# Patient Record
Sex: Female | Born: 1945 | Race: White | Hispanic: No | Marital: Married | Smoking: Former smoker
Health system: Southern US, Community
[De-identification: ages and names within clinical notes are randomized; demographics above are authoritative.]

## PROBLEM LIST (undated history)

## (undated) DIAGNOSIS — M1990 Unspecified osteoarthritis, unspecified site: Secondary | ICD-10-CM

## (undated) DIAGNOSIS — Z923 Personal history of irradiation: Secondary | ICD-10-CM

## (undated) DIAGNOSIS — K219 Gastro-esophageal reflux disease without esophagitis: Secondary | ICD-10-CM

## (undated) DIAGNOSIS — M7989 Other specified soft tissue disorders: Secondary | ICD-10-CM

## (undated) DIAGNOSIS — T7840XA Allergy, unspecified, initial encounter: Secondary | ICD-10-CM

## (undated) DIAGNOSIS — E119 Type 2 diabetes mellitus without complications: Secondary | ICD-10-CM

## (undated) DIAGNOSIS — C801 Malignant (primary) neoplasm, unspecified: Secondary | ICD-10-CM

## (undated) HISTORY — DX: Allergy, unspecified, initial encounter: T78.40XA

## (undated) HISTORY — DX: Gastro-esophageal reflux disease without esophagitis: K21.9

---

## 1985-05-11 DIAGNOSIS — Z923 Personal history of irradiation: Secondary | ICD-10-CM

## 1985-05-11 DIAGNOSIS — C801 Malignant (primary) neoplasm, unspecified: Secondary | ICD-10-CM

## 1985-05-11 HISTORY — DX: Malignant (primary) neoplasm, unspecified: C80.1

## 1985-05-11 HISTORY — PX: OTHER SURGICAL HISTORY: SHX169

## 1985-05-11 HISTORY — DX: Personal history of irradiation: Z92.3

## 1988-05-11 HISTORY — PX: CHOLECYSTECTOMY: SHX55

## 2004-04-16 ENCOUNTER — Ambulatory Visit: Payer: Self-pay | Admitting: General Surgery

## 2005-07-17 ENCOUNTER — Ambulatory Visit: Payer: Self-pay | Admitting: Family Medicine

## 2005-07-20 LAB — HM COLONOSCOPY

## 2006-09-09 ENCOUNTER — Ambulatory Visit: Payer: Self-pay | Admitting: Family Medicine

## 2006-09-14 ENCOUNTER — Ambulatory Visit: Payer: Self-pay | Admitting: Family Medicine

## 2007-03-21 ENCOUNTER — Ambulatory Visit: Payer: Self-pay | Admitting: Family Medicine

## 2008-12-27 ENCOUNTER — Ambulatory Visit: Payer: Self-pay | Admitting: Family Medicine

## 2009-07-11 LAB — HM PAP SMEAR: HM Pap smear: NORMAL

## 2010-03-10 ENCOUNTER — Ambulatory Visit: Payer: Self-pay | Admitting: Family Medicine

## 2010-09-15 ENCOUNTER — Ambulatory Visit: Payer: Self-pay | Admitting: Family Medicine

## 2011-04-30 ENCOUNTER — Ambulatory Visit: Payer: Self-pay | Admitting: Family Medicine

## 2011-11-17 ENCOUNTER — Encounter: Payer: Self-pay | Admitting: Orthopedic Surgery

## 2011-12-10 ENCOUNTER — Encounter: Payer: Self-pay | Admitting: Orthopedic Surgery

## 2012-05-11 HISTORY — PX: OTHER SURGICAL HISTORY: SHX169

## 2012-11-29 ENCOUNTER — Ambulatory Visit: Payer: Self-pay | Admitting: Gastroenterology

## 2012-11-29 LAB — HM COLONOSCOPY

## 2012-11-30 LAB — PATHOLOGY REPORT

## 2013-05-19 ENCOUNTER — Ambulatory Visit: Payer: Self-pay | Admitting: Family Medicine

## 2013-07-03 ENCOUNTER — Ambulatory Visit: Payer: Self-pay | Admitting: Anesthesiology

## 2013-07-04 ENCOUNTER — Ambulatory Visit: Payer: Self-pay | Admitting: Unknown Physician Specialty

## 2013-07-05 LAB — PATHOLOGY REPORT

## 2014-01-09 LAB — LIPID PANEL
Cholesterol: 182 mg/dL (ref 0–200)
HDL: 57 mg/dL (ref 35–70)
LDL Cholesterol: 108 mg/dL
Triglycerides: 87 mg/dL (ref 40–160)

## 2014-01-09 LAB — BASIC METABOLIC PANEL
BUN: 12 mg/dL (ref 4–21)
CREATININE: 0.8 mg/dL (ref 0.5–1.1)
GLUCOSE: 94 mg/dL
Potassium: 4.6 mmol/L (ref 3.4–5.3)
Sodium: 140 mmol/L (ref 137–147)

## 2014-01-09 LAB — CBC AND DIFFERENTIAL
HEMATOCRIT: 42 % (ref 36–46)
Hemoglobin: 13.6 g/dL (ref 12.0–16.0)
PLATELETS: 298 10*3/uL (ref 150–399)
WBC: 8.5 10^3/mL

## 2014-01-09 LAB — HEMOGLOBIN A1C: HEMOGLOBIN A1C: 5.7 % (ref 4.0–6.0)

## 2014-01-09 LAB — HEPATIC FUNCTION PANEL
ALT: 15 U/L (ref 7–35)
AST: 18 U/L (ref 13–35)
Alkaline Phosphatase: 116 U/L (ref 25–125)

## 2014-01-09 LAB — TSH: TSH: 4.09 u[IU]/mL (ref 0.41–5.90)

## 2014-03-12 ENCOUNTER — Ambulatory Visit: Payer: Self-pay | Admitting: Family Medicine

## 2014-03-12 LAB — HM DEXA SCAN

## 2014-03-21 ENCOUNTER — Ambulatory Visit: Payer: Self-pay | Admitting: Family Medicine

## 2014-03-21 LAB — HM MAMMOGRAPHY

## 2014-04-02 DIAGNOSIS — M1710 Unilateral primary osteoarthritis, unspecified knee: Secondary | ICD-10-CM | POA: Insufficient documentation

## 2014-04-02 DIAGNOSIS — M179 Osteoarthritis of knee, unspecified: Secondary | ICD-10-CM | POA: Insufficient documentation

## 2014-04-10 HISTORY — PX: KNEE ARTHROPLASTY: SHX992

## 2014-05-17 DIAGNOSIS — Z96659 Presence of unspecified artificial knee joint: Secondary | ICD-10-CM | POA: Insufficient documentation

## 2014-09-01 NOTE — Op Note (Signed)
PATIENT NAME:  Amanda Carpenter, WAMBLE MR#:  [id number] DATE OF BIRTH:  February 10, 1946  DATE OF PROCEDURE:  07/04/2013  PREOPERATIVE DIAGNOSIS: Right hemorrhagic vocal cord polyp.  POSTOPERATIVE DIAGNOSIS: Right hemorrhagic vocal cord polyp.   OPERATION PERFORMED: Microlaryngoscopy and excisional biopsy of right vocal cord polyp.   SURGEON: Roena Malady, M.D.   OPERATIVE FINDINGS: A hemorrhagic polyp of the right vocal fold.   DESCRIPTION OF PROCEDURE: Tiki was identified in the holding area, taken to the operating room and placed in supine position. After general endotracheal anesthesia, the table was turned 90 degrees. A Dedo laryngoscope was introduced into the airway and suspended. A tooth guard was placed prior to this to prevent damage to the teeth. With the patient suspended, the table was turned 90 degrees. A 0 degree Hopkins rod was brought into the field. Examination of the larynx showed a hemorrhagic polyp of the right anterior vocal fold with a small area of telangiectasia lateral to this. This was photo documented using the straight rod. With this done, the rod was removed. The operative microscope was brought on the field. Examination of the larynx was the same. A Cottonoid pledget with phenylephrine lidocaine was placed over the polypoid air for approximately 5 minutes and this was removed. The Shapshay microlaryngeal instruments were then brought onto the field. The 45 degree cup forceps were then used to gently grasp this small hemorrhagic polyp and it was excised using the 45 degree scissors. In a similar fashion, the telangiectasia was gently grasped with the straight cups and was also removed. With this removed, The area was then soaked with phenylephrine lidocaine solution. After 5 minutes, the pledget was removed. Examination showed no active bleeding. The photodocumentation was then performed following removal. The patient was then returned to anesthesia where she was awakened in  the operating room and taken to the recovery room in stable condition.   CULTURES: None.   SPECIMENS: Right hemorrhagic polyp.   ESTIMATED BLOOD LOSS: Less than 5 mL.  ____________________________ Roena Malady, MD ctm:aw D: 07/04/2013 10:12:42 ET T: 07/04/2013 10:28:34 ET JOB#: [id number]  cc: Roena Malady, MD, <Dictator> Roena Malady MD ELECTRONICALLY SIGNED 08/08/2013 18:01

## 2014-10-02 DIAGNOSIS — K219 Gastro-esophageal reflux disease without esophagitis: Secondary | ICD-10-CM | POA: Insufficient documentation

## 2014-10-02 DIAGNOSIS — J309 Allergic rhinitis, unspecified: Secondary | ICD-10-CM | POA: Insufficient documentation

## 2014-10-02 DIAGNOSIS — I8390 Asymptomatic varicose veins of unspecified lower extremity: Secondary | ICD-10-CM | POA: Insufficient documentation

## 2014-10-02 DIAGNOSIS — E039 Hypothyroidism, unspecified: Secondary | ICD-10-CM | POA: Insufficient documentation

## 2014-10-02 DIAGNOSIS — N390 Urinary tract infection, site not specified: Secondary | ICD-10-CM | POA: Insufficient documentation

## 2014-10-02 DIAGNOSIS — K52831 Collagenous colitis: Secondary | ICD-10-CM | POA: Insufficient documentation

## 2014-10-02 DIAGNOSIS — Z85819 Personal history of malignant neoplasm of unspecified site of lip, oral cavity, and pharynx: Secondary | ICD-10-CM | POA: Insufficient documentation

## 2014-10-02 DIAGNOSIS — E7800 Pure hypercholesterolemia, unspecified: Secondary | ICD-10-CM | POA: Insufficient documentation

## 2014-10-02 DIAGNOSIS — R0602 Shortness of breath: Secondary | ICD-10-CM | POA: Insufficient documentation

## 2014-10-02 DIAGNOSIS — M8580 Other specified disorders of bone density and structure, unspecified site: Secondary | ICD-10-CM | POA: Insufficient documentation

## 2014-10-02 DIAGNOSIS — R5383 Other fatigue: Secondary | ICD-10-CM | POA: Insufficient documentation

## 2014-10-02 DIAGNOSIS — M1990 Unspecified osteoarthritis, unspecified site: Secondary | ICD-10-CM | POA: Insufficient documentation

## 2014-10-02 DIAGNOSIS — K922 Gastrointestinal hemorrhage, unspecified: Secondary | ICD-10-CM | POA: Insufficient documentation

## 2014-10-02 DIAGNOSIS — R609 Edema, unspecified: Secondary | ICD-10-CM | POA: Insufficient documentation

## 2014-10-02 DIAGNOSIS — E559 Vitamin D deficiency, unspecified: Secondary | ICD-10-CM | POA: Insufficient documentation

## 2014-10-02 DIAGNOSIS — R234 Changes in skin texture: Secondary | ICD-10-CM | POA: Insufficient documentation

## 2014-10-02 DIAGNOSIS — M25569 Pain in unspecified knee: Secondary | ICD-10-CM | POA: Insufficient documentation

## 2015-01-09 ENCOUNTER — Encounter: Payer: Self-pay | Admitting: Family Medicine

## 2015-01-09 ENCOUNTER — Ambulatory Visit (INDEPENDENT_AMBULATORY_CARE_PROVIDER_SITE_OTHER): Payer: Commercial Managed Care - HMO | Admitting: Family Medicine

## 2015-01-09 VITALS — BP 128/72 | HR 66 | Temp 98.3°F | Resp 16 | Ht 63.0 in | Wt 228.0 lb

## 2015-01-09 DIAGNOSIS — Z23 Encounter for immunization: Secondary | ICD-10-CM | POA: Diagnosis not present

## 2015-01-09 DIAGNOSIS — Z0000 Encounter for general adult medical examination without abnormal findings: Secondary | ICD-10-CM | POA: Diagnosis not present

## 2015-01-09 DIAGNOSIS — R7309 Other abnormal glucose: Secondary | ICD-10-CM

## 2015-01-09 DIAGNOSIS — E7800 Pure hypercholesterolemia, unspecified: Secondary | ICD-10-CM

## 2015-01-09 DIAGNOSIS — E039 Hypothyroidism, unspecified: Secondary | ICD-10-CM | POA: Diagnosis not present

## 2015-01-09 DIAGNOSIS — D692 Other nonthrombocytopenic purpura: Secondary | ICD-10-CM

## 2015-01-09 DIAGNOSIS — R7303 Prediabetes: Secondary | ICD-10-CM

## 2015-01-09 NOTE — Progress Notes (Signed)
Patient ID: Amanda Carpenter, female   DOB: 03/14/1946, 69 y.o.   MRN: [id number]        Patient: Amanda Carpenter, Female    DOB: 06/01/1945, 69 y.o.   MRN: [id number] Visit Date: 01/09/2015  Today's Provider: Margarita Rana, MD   Chief Complaint  Patient presents with  . Medicare Wellness   Subjective:    Annual wellness visit Amanda Carpenter is a 69 y.o. female. She feels well. She reports exercising 5 times a week. She reports she is sleeping well.  01/04/14 CPE 08/07/09 Pap-neg 03/21/14 Mammogram-BI-RADS 1 12/01/12 Colonoscopy-recheck in 5 yrs, (less than ideal prep) 03/12/14 BMD-Osteopenia  Lab Results  Component Value Date   WBC 8.5 01/09/2014   HGB 13.6 01/09/2014   HCT 42 01/09/2014   PLT 298 01/09/2014   CHOL 182 01/09/2014   TRIG 87 01/09/2014   HDL 57 01/09/2014   LDLCALC 108 01/09/2014   ALT 15 01/09/2014   AST 18 01/09/2014   NA 140 01/09/2014   K 4.6 01/09/2014   CREATININE 0.8 01/09/2014   BUN 12 01/09/2014   TSH 4.09 01/09/2014   HGBA1C 5.7 01/09/2014     -----------------------------------------------------------   Review of Systems  Constitutional: Negative.   HENT: Negative.   Eyes: Negative.   Respiratory: Negative.   Gastrointestinal: Negative.   Endocrine: Negative.   Genitourinary: Negative.   Musculoskeletal: Positive for joint swelling, arthralgias and gait problem.  Skin: Negative.   Allergic/Immunologic: Negative.   Neurological: Negative.   Hematological: Negative.     Social History   Social History  . Marital Status: Married    Spouse Name: Deidre Ala  . Number of Children: 2  . Years of Education: N/A   Occupational History  . Not on file.   Social History Main Topics  . Smoking status: Former Smoker    Quit date: 05/11/1985  . Smokeless tobacco: Never Used  . Alcohol Use: No  . Drug Use: No  . Sexual Activity: Not on file   Other Topics Concern  . Not on file   Social History Narrative    Patient Active Problem  List   Diagnosis Date Noted  . Allergic rhinitis 10/02/2014  . Arthritis 10/02/2014  . 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 Accumulation of fluid in tissues 10/02/2014  . Fatigue 10/02/2014  . Acid reflux 10/02/2014  . Bleeding gastrointestinal 10/02/2014  . 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 Hypercholesteremia 10/02/2014  . Adult hypothyroidism 10/02/2014  . Gonalgia 10/02/2014  . Adiposity 10/02/2014  . Osteopenia 10/02/2014  . Breath shortness 10/02/2014  . Skin thinning 10/02/2014  . Infection of urinary tract 10/02/2014  . 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 Avitaminosis D 10/02/2014  . 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 Borderline diabetes 08/14/2009    Past Surgical History  Procedure Laterality Date  . Cholecystectomy  1994    Dr. Bary Castilla  . 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 Knee arthroplasty Left 04/2014    Her family history is not on file.    Previous Medications   OMEPRAZOLE (PRILOSEC) 20 MG CAPSULE    Take 20 mg by mouth 2 (two) times daily before a meal.   VITAMIN D, ERGOCALCIFEROL, (DRISDOL) 50000 UNITS CAPS CAPSULE    Take 50,000 Units by mouth every 7 (seven) days.    Patient Care Team: Jerrol Banana., MD as PCP - General (Family Medicine)  Objective:   Vitals: BP 128/72 mmHg  Pulse 66  Temp(Src) 98.3 F (36.8 C) (Oral)  Resp 16  Ht 5\' 3"  (1.6 m)  Wt 228 lb (103.42 kg)  BMI 40.40 kg/m2  SpO2 97%  Physical Exam  Constitutional: She is oriented to person, place, and time. She appears well-developed and well-nourished.  HENT:  Head: Normocephalic and atraumatic.  Right Ear: Tympanic membrane, external ear and ear canal normal.  Left Ear: Tympanic membrane, external ear and ear canal normal.  Nose: Nose normal.  Mouth/Throat: Uvula is midline, oropharynx is clear and moist and mucous membranes are normal.  Eyes: Conjunctivae, EOM and lids are normal.  Pupils are equal, round, and reactive to light.  Neck: Trachea normal and normal range of motion. Neck supple. Carotid bruit is not present. No thyroid mass and no thyromegaly present.  Cardiovascular: Normal rate, regular rhythm and normal heart sounds.   Pulmonary/Chest: Effort normal and breath sounds normal.  Abdominal: Soft. Normal appearance and bowel sounds are normal. There is no hepatosplenomegaly. There is no tenderness.  Genitourinary: No breast swelling, tenderness or discharge.  Musculoskeletal: Normal range of motion.  Lymphadenopathy:    She has no cervical adenopathy.    She has no axillary adenopathy.  Neurological: She is alert and oriented to person, place, and time. She has normal strength. No cranial nerve deficit.  Skin: Skin is warm, dry and intact.  Heme staining, thick toe nails.  Psychiatric: She has a normal mood and affect. Her speech is normal and behavior is normal. Judgment and thought content normal. Cognition and memory are normal.    Activities of Daily Living In your present state of health, do you have any difficulty performing the following activities: 01/09/2015  Hearing? N  Vision? N  Difficulty concentrating or making decisions? Y  Walking or climbing stairs? N  Dressing or bathing? N  Doing errands, shopping? N    Fall Risk Assessment Fall Risk  01/09/2015  Falls in the past year? No     Depression Screen PHQ 2/9 Scores 01/09/2015  PHQ - 2 Score 0    Cognitive Testing - 6-CIT  Correct? Score   What year is it? yes 0 0 or 4  What month is it? yes 0 0 or 3  Memorize:    Pia Mau,  41,  [address],  [place],      What time is it? (within 1 hour) yes 0 0 or 3  Count backwards from 20 yes 0 0, 2, or 4  Name the months of the year yes 0 0, 2, or 4  Repeat name & address above yes 0 0, 2, 4, 6, 8, or 10       TOTAL SCORE  0/28   Interpretation:  Normal  Normal (0-7) Abnormal (8-28)       Assessment & Plan:     Annual Wellness  Visit  Reviewed patient's Family Medical History Reviewed and updated list of patient's medical providers Assessment of cognitive impairment was done Assessed patient's functional ability Established a written schedule for health screening [place] Completed and Reviewed  Exercise Activities and Dietary recommendations Goals    . Exercise 150 minutes per week (moderate activity)       Immunization History  Administered Date(s) Administered  . Pneumococcal Conjugate-13 01/09/2015  . Pneumococcal Polysaccharide-23 11/02/2012  . Td 05/20/2005  . Tdap 02/16/2011    Health Maintenance  Topic Date Due  . Hepatitis C Screening  May 05, 1946  . ZOSTAVAX  01/28/2005  . DEXA SCAN  01/28/2010  . INFLUENZA VACCINE  12/10/2014  . COLONOSCOPY  07/21/2015  . MAMMOGRAM  03/21/2016  . TETANUS/TDAP  02/15/2021  . PNA vac Low Risk Adult  Completed       1. Medicare annual wellness visit, subsequent Stable. Patient advised to continue eating healthy and exercise daily.  2. Borderline diabetes - Hemoglobin A1c  3. Hypothyroidism, unspecified hypothyroidism type - CBC with Differential/Platelet - Comprehensive metabolic panel - T4 AND TSH  4. Hypercholesteremia - CBC with Differential/Platelet - Comprehensive metabolic panel - Lipid Panel With LDL/HDL Ratio  5. Need for pneumococcal vaccination - Pneumococcal conjugate vaccine 13-valent IM  6. Senile purpura- New diagnosis.      Patient seen and examined by Dr. Jerrell Belfast, and note scribed by Philbert Riser. Dimas, CMA.  I have reviewed the document for accuracy and completeness and I agree with above. Jerrell Belfast, MD     ------------------------------------------------------------------------------------------------------------

## 2015-01-09 NOTE — Progress Notes (Deleted)
   Subjective:    Patient ID: Amanda Carpenter, female    DOB: 04-10-1946, 69 y.o.   MRN: [id number]  HPI    Review of Systems     Objective:   Physical Exam        Assessment & Plan:

## 2015-01-10 LAB — LIPID PANEL WITH LDL/HDL RATIO
CHOLESTEROL TOTAL: 223 mg/dL — AB (ref 100–199)
HDL: 58 mg/dL (ref >39)
LDL CALC: 142 mg/dL — AB (ref 0–99)
LDl/HDL Ratio: 2.4 ratio units (ref 0.0–3.2)
TRIGLYCERIDES: 113 mg/dL (ref 0–149)
VLDL CHOLESTEROL CAL: 23 mg/dL (ref 5–40)

## 2015-01-10 LAB — COMPREHENSIVE METABOLIC PANEL
ALK PHOS: 128 IU/L — AB (ref 39–117)
ALT: 11 IU/L (ref 0–32)
AST: 16 IU/L (ref 0–40)
Albumin/Globulin Ratio: 2 (ref 1.1–2.5)
Albumin: 4.4 g/dL (ref 3.6–4.8)
BUN/Creatinine Ratio: 17 (ref 11–26)
BUN: 13 mg/dL (ref 8–27)
Bilirubin Total: 0.3 mg/dL (ref 0.0–1.2)
CALCIUM: 9.7 mg/dL (ref 8.7–10.3)
CO2: 25 mmol/L (ref 18–29)
CREATININE: 0.77 mg/dL (ref 0.57–1.00)
Chloride: 100 mmol/L (ref 97–108)
GFR calc Af Amer: 91 mL/min/{1.73_m2} (ref >59)
GFR, EST NON AFRICAN AMERICAN: 79 mL/min/{1.73_m2} (ref >59)
GLUCOSE: 103 mg/dL — AB (ref 65–99)
Globulin, Total: 2.2 g/dL (ref 1.5–4.5)
Potassium: 4.5 mmol/L (ref 3.5–5.2)
Sodium: 139 mmol/L (ref 134–144)
Total Protein: 6.6 g/dL (ref 6.0–8.5)

## 2015-01-10 LAB — CBC WITH DIFFERENTIAL/PLATELET
BASOS ABS: 0.1 10*3/uL (ref 0.0–0.2)
Basos: 1 %
EOS (ABSOLUTE): 0.1 10*3/uL (ref 0.0–0.4)
EOS: 2 %
HEMOGLOBIN: 13.7 g/dL (ref 11.1–15.9)
Hematocrit: 40.2 % (ref 34.0–46.6)
IMMATURE GRANULOCYTES: 0 %
Immature Grans (Abs): 0 10*3/uL (ref 0.0–0.1)
LYMPHS ABS: 2.3 10*3/uL (ref 0.7–3.1)
Lymphs: 27 %
MCH: 28.9 pg (ref 26.6–33.0)
MCHC: 34.1 g/dL (ref 31.5–35.7)
MCV: 85 fL (ref 79–97)
MONOCYTES: 7 %
MONOS ABS: 0.6 10*3/uL (ref 0.1–0.9)
NEUTROS PCT: 63 %
Neutrophils Absolute: 5.3 10*3/uL (ref 1.4–7.0)
Platelets: 308 10*3/uL (ref 150–379)
RBC: 4.74 x10E6/uL (ref 3.77–5.28)
RDW: 14.2 % (ref 12.3–15.4)
WBC: 8.3 10*3/uL (ref 3.4–10.8)

## 2015-01-10 LAB — HEMOGLOBIN A1C
Est. average glucose Bld gHb Est-mCnc: 117 mg/dL
HEMOGLOBIN A1C: 5.7 % — AB (ref 4.8–5.6)

## 2015-01-10 LAB — T4 AND TSH
T4, Total: 9.1 ug/dL (ref 4.5–12.0)
TSH: 3.08 u[IU]/mL (ref 0.450–4.500)

## 2015-01-11 ENCOUNTER — Telehealth: Payer: Self-pay

## 2015-01-11 NOTE — Telephone Encounter (Signed)
-----   Message from Margarita Rana, MD sent at 01/10/2015  4:11 PM EDT ----- Labs stable.  Cholesterol slightly elevated from previously. Recommend eat healthy and exercise and recheck annually. Thanks.

## 2015-01-11 NOTE — Telephone Encounter (Signed)
LMTCB 01/11/2015  Thanks,   -Islam Eichinger  

## 2015-01-11 NOTE — Telephone Encounter (Signed)
Pt advised as directed below.   Thanks,   -Dewell Monnier  

## 2015-02-19 ENCOUNTER — Telehealth: Payer: Self-pay

## 2015-02-19 NOTE — Telephone Encounter (Signed)
Pt called because [employer] will not cover recent labs. Claims reports that the labs were billed under a general health panel code. Sent a corrective claim to fax # [phone]. Pt informed that corrective claim was sent in. Renaldo Fiddler, CMA

## 2015-03-19 ENCOUNTER — Other Ambulatory Visit: Payer: Self-pay | Admitting: Family Medicine

## 2015-03-19 MED ORDER — TRAMADOL HCL 50 MG PO TABS: 50.0000 mg | ORAL_TABLET | ORAL | Status: DC | PRN

## 2015-03-19 NOTE — Telephone Encounter (Signed)
Looks like patient is seen Dr. Venia Minks for CPEs but otherwise sees Dr. Rosanna Randy. Last time we wrote Tramadol was on 03/29/13 for knee pain and no more after that. Please review. Needs appt?-aa

## 2015-03-19 NOTE — Telephone Encounter (Signed)
Done-aa 

## 2015-03-19 NOTE — Telephone Encounter (Signed)
Tramadol 50mg  q 4 hrs prn,#100,5rf.

## 2015-03-19 NOTE — Telephone Encounter (Signed)
Pt stated that she had her CPE with Dr. Venia Minks in August and forgot to request a refill on Tramadol. Pt stated that her ortho had been prescribing the medication. Dr. Rosanna Randy wrote it for pt on 03/29/13. I advised pt she might need an OV. Please advise. Thanks TNP

## 2015-04-08 ENCOUNTER — Ambulatory Visit (INDEPENDENT_AMBULATORY_CARE_PROVIDER_SITE_OTHER): Payer: Commercial Managed Care - HMO | Admitting: Family Medicine

## 2015-04-08 VITALS — BP 118/74 | HR 80 | Temp 98.4°F | Resp 18 | Ht 62.0 in | Wt 232.0 lb

## 2015-04-08 DIAGNOSIS — R059 Cough, unspecified: Secondary | ICD-10-CM

## 2015-04-08 DIAGNOSIS — R05 Cough: Secondary | ICD-10-CM | POA: Diagnosis not present

## 2015-04-08 DIAGNOSIS — J40 Bronchitis, not specified as acute or chronic: Secondary | ICD-10-CM

## 2015-04-08 MED ORDER — AMOXICILLIN-POT CLAVULANATE 875-125 MG PO TABS: 1.0000 | ORAL_TABLET | Freq: Two times a day (BID) | ORAL | Status: DC

## 2015-04-08 NOTE — Progress Notes (Signed)
Patient ID: Amanda Carpenter, female   DOB: [birth year], 69 y.o.   MRN: [id number]   Amanda Carpenter  MRN: [id number] DOB: Sep 07, 1945  Subjective:  HPI   1. Cough Patient is a 69 year old female who presents for evaluation of cough.  She states she began coughing 4 days ago.  She is able to cough some sputum up and notes it has been greenish in color.  She has not used any expectorant but states she had some old Tussinex at home and has been using that at night and it does help her to sleep.  No other family members have had any of the same symptoms.  Patient has not had a flu vaccine this year and states that she does not usually take one.  Patient Active Problem List   Diagnosis Date Noted  . Allergic rhinitis 10/02/2014  . Arthritis 10/02/2014  . 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 Accumulation of fluid in tissues 10/02/2014  . Fatigue 10/02/2014  . Acid reflux 10/02/2014  . Bleeding gastrointestinal 10/02/2014  . 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 Hypercholesteremia 10/02/2014  . Adult hypothyroidism 10/02/2014  . Gonalgia 10/02/2014  . Adiposity 10/02/2014  . Osteopenia 10/02/2014  . Breath shortness 10/02/2014  . Skin thinning 10/02/2014  . Infection of urinary tract 10/02/2014  . 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 Avitaminosis D 10/02/2014  . 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 Borderline diabetes 08/14/2009    Past Medical History  Diagnosis Date  . GERD (gastroesophageal reflux disease)   . Allergy     Social History   Social History  . Marital Status: Married    Spouse Name: Deidre Ala  . Number of Children: 2  . Years of Education: N/A   Occupational History  . Not on file.   Social History Main Topics  . Smoking status: Former Smoker    Quit date: 05/11/1985  . Smokeless tobacco: Never Used  . Alcohol Use: No  . Drug Use: No  . Sexual Activity: Not on file   Other Topics Concern  . Not on  file   Social History Narrative    Outpatient Prescriptions Prior to Visit  Medication Sig Dispense Refill  . omeprazole (PRILOSEC) 20 MG capsule Take 20 mg by mouth 2 (two) times daily before a meal.    . traMADol (ULTRAM) 50 MG tablet Take 1 tablet (50 mg total) by mouth every 4 (four) hours as needed. 100 tablet 5  . Vitamin D, Ergocalciferol, (DRISDOL) 50000 UNITS CAPS capsule Take 50,000 Units by mouth every 7 (seven) days.     No facility-administered medications prior to visit.    No Known Allergies  Review of Systems  Constitutional: Positive for chills and malaise/fatigue. Negative for fever and diaphoresis.  HENT: Positive for congestion, sore throat and tinnitus (Chronic and unchanged.). Negative for ear discharge, ear pain, hearing loss and nosebleeds.   Eyes: Negative for blurred vision, double vision, photophobia, pain, discharge and redness.  Respiratory: Positive for cough, sputum production and wheezing. Negative for hemoptysis, shortness of breath and stridor.   Cardiovascular: Negative for chest pain, palpitations, orthopnea and leg swelling.  Neurological: Negative for weakness and headaches.   Objective:  BP 118/74 mmHg  Pulse 80  Temp(Src) 98.4 F (36.9 C) (Oral)  Resp 18  Ht 5\' 2"  (1.575 m)  Wt 232 lb (105.235 kg)  BMI 42.42 kg/m2  Physical Exam  Constitutional: She is well-developed, well-nourished, and  in no distress.  HENT:  Head: Normocephalic and atraumatic.  Right Ear: External ear normal.  Left Ear: External ear normal.  Nose: Nose normal.  Mouth/Throat: Oropharynx is clear and moist.  Eyes: Conjunctivae and EOM are normal. Pupils are equal, round, and reactive to light.  Neck: Normal range of motion. Neck supple.  Chronic right side goiter  Cardiovascular: Normal rate, regular rhythm and normal heart sounds.   Pulmonary/Chest: Effort normal and breath sounds normal.    Assessment and Plan :  Cough  URI Bronchits Rx if she  worsens. Miguel Aschoff MD [facility] 04/08/2015 9:37 AM

## 2015-05-27 DIAGNOSIS — M1711 Unilateral primary osteoarthritis, right knee: Secondary | ICD-10-CM | POA: Diagnosis not present

## 2015-05-27 DIAGNOSIS — M25561 Pain in right knee: Secondary | ICD-10-CM | POA: Diagnosis not present

## 2015-05-31 ENCOUNTER — Other Ambulatory Visit: Payer: Self-pay | Admitting: Family Medicine

## 2015-05-31 MED ORDER — OMEPRAZOLE 20 MG PO CPDR: 20.0000 mg | DELAYED_RELEASE_CAPSULE | Freq: Two times a day (BID) | ORAL | Status: DC

## 2015-05-31 MED ORDER — VITAMIN D (ERGOCALCIFEROL) 1.25 MG (50000 UNIT) PO CAPS: 50000.0000 [IU] | ORAL_CAPSULE | ORAL | Status: DC

## 2015-05-31 NOTE — Telephone Encounter (Signed)
This is not Dr. Alben Spittle patient. Thank you-aa

## 2015-05-31 NOTE — Telephone Encounter (Signed)
Yes--1 year refills. 

## 2015-05-31 NOTE — Telephone Encounter (Signed)
Pt contacted office for refill request on the following medications: omeprazole (PRILOSEC) 20 MG capsule & Vitamin D, Ergocalciferol, (DRISDOL) 50000 UNITS CAPS capsule to [facility] for 90 day supply. Thanks TNP

## 2015-05-31 NOTE — Telephone Encounter (Signed)
Is this ok?-aa 

## 2015-06-03 ENCOUNTER — Ambulatory Visit (INDEPENDENT_AMBULATORY_CARE_PROVIDER_SITE_OTHER): Payer: PPO | Admitting: Family Medicine

## 2015-06-03 VITALS — BP 120/80 | HR 78 | Temp 98.0°F | Resp 16 | Wt 228.0 lb

## 2015-06-03 DIAGNOSIS — R600 Localized edema: Secondary | ICD-10-CM

## 2015-06-03 DIAGNOSIS — K219 Gastro-esophageal reflux disease without esophagitis: Secondary | ICD-10-CM

## 2015-06-03 DIAGNOSIS — E559 Vitamin D deficiency, unspecified: Secondary | ICD-10-CM | POA: Diagnosis not present

## 2015-06-03 DIAGNOSIS — R609 Edema, unspecified: Secondary | ICD-10-CM | POA: Diagnosis not present

## 2015-06-03 MED ORDER — FUROSEMIDE 20 MG PO TABS: 20.0000 mg | ORAL_TABLET | Freq: Every day | ORAL | Status: DC

## 2015-06-03 MED ORDER — VITAMIN D 1000 UNITS PO TABS: 1000.0000 [IU] | ORAL_TABLET | Freq: Every day | ORAL | Status: DC

## 2015-06-03 MED ORDER — OMEPRAZOLE 20 MG PO CPDR: 20.0000 mg | DELAYED_RELEASE_CAPSULE | Freq: Two times a day (BID) | ORAL | Status: DC

## 2015-06-03 NOTE — Progress Notes (Signed)
Patient ID: Amanda Carpenter, female   DOB: 1945-07-11, 70 y.o.   MRN: [id number]   Lema Stidham  MRN: [id number] DOB: [birth year]  Subjective:  HPI  1. Swelling The patient is a 70 year old female who presents for evaluation of swelling.  She has noticed swelling of her lower extremity, right lower leg.  She states that she has a bad knee on that side.  She recently had an ultrasound done at [facility] and it did reveal fluid on her knee.  She decided not to have the fluid aspirated.  The patient found some Lasix that she had been given in the past and has used that for a few days with some relief.  Patient Active Problem List   Diagnosis Date Noted  . Allergic rhinitis 10/02/2014  . Arthritis 10/02/2014  . 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 Accumulation of fluid in tissues 10/02/2014  . Fatigue 10/02/2014  . Acid reflux 10/02/2014  . Bleeding gastrointestinal 10/02/2014  . 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 Hypercholesteremia 10/02/2014  . Adult hypothyroidism 10/02/2014  . Gonalgia 10/02/2014  . Adiposity 10/02/2014  . Osteopenia 10/02/2014  . Breath shortness 10/02/2014  . Skin thinning 10/02/2014  . Infection of urinary tract 10/02/2014  . 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 Avitaminosis D 10/02/2014  . 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 Borderline diabetes 08/14/2009    Past Medical History  Diagnosis Date  . GERD (gastroesophageal reflux disease)   . Allergy     Social History   Social History  . Marital Status: Married    Spouse Name: Deidre Ala  . Number of Children: 2  . Years of Education: N/A   Occupational History  . Not on file.   Social History Main Topics  . Smoking status: Former Smoker    Quit date: 05/11/1985  . Smokeless tobacco: Never Used  . Alcohol Use: No  . Drug Use: No  . Sexual Activity: Not on file   Other Topics Concern  . Not on file   Social History Narrative     Outpatient Prescriptions Prior to Visit  Medication Sig Dispense Refill  . omeprazole (PRILOSEC) 20 MG capsule Take 1 capsule (20 mg total) by mouth 2 (two) times daily before a meal. 60 capsule 5  . traMADol (ULTRAM) 50 MG tablet Take 1 tablet (50 mg total) by mouth every 4 (four) hours as needed. 100 tablet 5  . Vitamin D, Ergocalciferol, (DRISDOL) 50000 units CAPS capsule Take 1 capsule (50,000 Units total) by mouth every 7 (seven) days. 12 capsule 3  . amoxicillin-clavulanate (AUGMENTIN) 875-125 MG tablet Take 1 tablet by mouth 2 (two) times daily. 14 tablet 0   No facility-administered medications prior to visit.    No Known Allergies  Review of Systems  Constitutional: Negative for fever and chills.  Respiratory: Negative for cough.   Cardiovascular: Positive for leg swelling. Negative for chest pain, palpitations and orthopnea.  Musculoskeletal: Positive for joint pain (Knees). Negative for myalgias, back pain and neck pain.  Neurological: Negative for weakness.   Objective:  BP 120/80 mmHg  Pulse 78  Temp(Src) 98 F (36.7 C) (Oral)  Resp 16  Wt 228 lb (103.42 kg)  Physical Exam  Assessment and Plan :    1. Swelling  - furosemide (LASIX) 20 MG tablet; Take 1 tablet (20 mg total) by mouth daily.  Dispense: 90 tablet; Refill: 3  2. Lower leg edema  - furosemide (  LASIX) 20 MG tablet; Take 1 tablet (20 mg total) by mouth daily.  Dispense: 90 tablet; Refill: 3  3. Vitamin D deficiency  - cholecalciferol (VITAMIN D) 1000 units tablet; Take 1 tablet (1,000 Units total) by mouth daily.  Dispense: 90 tablet; Refill: 3  4. Gastroesophageal reflux disease, esophagitis presence not specified  - omeprazole (PRILOSEC) 20 MG capsule; Take 1 capsule (20 mg total) by mouth 2 (two) times daily before a meal.  Dispense: 180 capsule; Refill: [facility] 06/03/2015 3:00 PM

## 2015-06-04 ENCOUNTER — Other Ambulatory Visit: Payer: Self-pay

## 2015-07-18 DIAGNOSIS — H903 Sensorineural hearing loss, bilateral: Secondary | ICD-10-CM | POA: Diagnosis not present

## 2015-07-18 DIAGNOSIS — H9313 Tinnitus, bilateral: Secondary | ICD-10-CM | POA: Diagnosis not present

## 2015-08-09 DIAGNOSIS — M1711 Unilateral primary osteoarthritis, right knee: Secondary | ICD-10-CM | POA: Diagnosis not present

## 2015-09-24 DIAGNOSIS — M1711 Unilateral primary osteoarthritis, right knee: Secondary | ICD-10-CM | POA: Diagnosis not present

## 2015-11-05 ENCOUNTER — Emergency Department
Admission: EM | Admit: 2015-11-05 | Discharge: 2015-11-05 | Disposition: A | Discharge status: Home / Self Care | Payer: PPO | Attending: Emergency Medicine | Admitting: Emergency Medicine

## 2015-11-05 DIAGNOSIS — W1839XA Other fall on same level, initial encounter: Secondary | ICD-10-CM | POA: Insufficient documentation

## 2015-11-05 DIAGNOSIS — M1990 Unspecified osteoarthritis, unspecified site: Secondary | ICD-10-CM | POA: Diagnosis not present

## 2015-11-05 DIAGNOSIS — Y939 Activity, unspecified: Secondary | ICD-10-CM | POA: Insufficient documentation

## 2015-11-05 DIAGNOSIS — S81812A Laceration without foreign body, left lower leg, initial encounter: Secondary | ICD-10-CM | POA: Diagnosis not present

## 2015-11-05 DIAGNOSIS — Z87891 Personal history of nicotine dependence: Secondary | ICD-10-CM | POA: Insufficient documentation

## 2015-11-05 DIAGNOSIS — M179 Osteoarthritis of knee, unspecified: Secondary | ICD-10-CM | POA: Diagnosis not present

## 2015-11-05 DIAGNOSIS — Y9289 Other specified places as the place of occurrence of the external cause: Secondary | ICD-10-CM | POA: Diagnosis not present

## 2015-11-05 DIAGNOSIS — Z79899 Other long term (current) drug therapy: Secondary | ICD-10-CM | POA: Insufficient documentation

## 2015-11-05 DIAGNOSIS — Z96659 Presence of unspecified artificial knee joint: Secondary | ICD-10-CM | POA: Diagnosis not present

## 2015-11-05 DIAGNOSIS — E039 Hypothyroidism, unspecified: Secondary | ICD-10-CM | POA: Insufficient documentation

## 2015-11-05 DIAGNOSIS — Y999 Unspecified external cause status: Secondary | ICD-10-CM | POA: Diagnosis not present

## 2015-11-05 MED ORDER — LIDOCAINE HCL (PF) 1 % IJ SOLN
INTRAMUSCULAR | Status: AC
  Administered 2015-11-05: 18:00:00
  Filled 2015-11-05: qty 5

## 2015-11-05 MED ORDER — TRAMADOL HCL 50 MG PO TABS: 50.0000 mg | ORAL_TABLET | Freq: Four times a day (QID) | ORAL | Status: DC | PRN

## 2015-11-05 NOTE — Discharge Instructions (Signed)
Laceration Care, Adult  A laceration is a cut that goes through all layers of the skin. The cut also goes into the tissue that is right under the skin. Some cuts heal on their own. Others need to be closed with stitches (sutures), staples, skin adhesive strips, or wound glue. Taking care of your cut lowers your risk of infection and helps your cut to heal better.  HOW TO TAKE CARE OF YOUR CUT  For stitches or staples:  · Keep the wound clean and dry.  · If you were given a bandage (dressing), you should change it at least one time per day or as told by your doctor. You should also change it if it gets wet or dirty.  · Keep the wound completely dry for the first 24 hours or as told by your doctor. After that time, you may take a shower or a bath. However, make sure that the wound is not soaked in water until after the stitches or staples have been removed.  · Clean the wound one time each day or as told by your doctor:    Wash the wound with soap and water.    Rinse the wound with water until all of the soap comes off.    Pat the wound dry with a clean towel. Do not rub the wound.  · After you clean the wound, put a thin layer of antibiotic ointment on it as told by your doctor. This ointment:    Helps to prevent infection.    Keeps the bandage from sticking to the wound.  · Have your stitches or staples removed as told by your doctor.  If your doctor used skin adhesive strips:   · Keep the wound clean and dry.  · If you were given a bandage, you should change it at least one time per day or as told by your doctor. You should also change it if it gets dirty or wet.  · Do not get the skin adhesive strips wet. You can take a shower or a bath, but be careful to keep the wound dry.  · If the wound gets wet, pat it dry with a clean towel. Do not rub the wound.  · Skin adhesive strips fall off on their own. You can trim the strips as the wound heals. Do not remove any strips that are still stuck to the wound. They will  fall off after a while.  If your doctor used wound glue:  · Try to keep your wound dry, but you may briefly wet it in the shower or bath. Do not soak the wound in water, such as by swimming.  · After you take a shower or a bath, gently pat the wound dry with a clean towel. Do not rub the wound.  · Do not do any activities that will make you really sweaty until the skin glue has fallen off on its own.  · Do not apply liquid, cream, or ointment medicine to your wound while the skin glue is still on.  · If you were given a bandage, you should change it at least one time per day or as told by your doctor. You should also change it if it gets dirty or wet.  · If a bandage is placed over the wound, do not let the tape for the bandage touch the skin glue.  · Do not pick at the glue. The skin glue usually stays on for 5-10 days. Then, it   falls off of the skin.  General Instructions   · To help prevent scarring, make sure to cover your wound with sunscreen whenever you are outside after stitches are removed, after adhesive strips are removed, or when wound glue stays in place and the wound is healed. Make sure to wear a sunscreen of at least 30 SPF.  · Take over-the-counter and prescription medicines only as told by your doctor.  · If you were given antibiotic medicine or ointment, take or apply it as told by your doctor. Do not stop using the antibiotic even if your wound is getting better.  · Do not scratch or pick at the wound.  · Keep all follow-up visits as told by your doctor. This is important.  · Check your wound every day for signs of infection. Watch for:    Redness, swelling, or pain.    Fluid, blood, or pus.  · Raise (elevate) the injured area above the level of your heart while you are sitting or lying down, if possible.  GET HELP IF:  · You got a tetanus shot and you have any of these problems at the injection site:    Swelling.    Very bad pain.    Redness.    Bleeding.  · You have a fever.  · A wound that was  closed breaks open.  · You notice a bad smell coming from your wound or your bandage.  · You notice something coming out of the wound, such as wood or glass.  · Medicine does not help your pain.  · You have more redness, swelling, or pain at the site of your wound.  · You have fluid, blood, or pus coming from your wound.  · You notice a change in the color of your skin near your wound.  · You need to change the bandage often because fluid, blood, or pus is coming from the wound.  · You start to have a new rash.  · You start to have numbness around the wound.  GET HELP RIGHT AWAY IF:  · You have very bad swelling around the wound.  · Your pain suddenly gets worse and is very bad.  · You notice painful lumps near the wound or on skin that is anywhere on your body.  · You have a red streak going away from your wound.  · The wound is on your hand or foot and you cannot move a finger or toe like you usually can.  · The wound is on your hand or foot and you notice that your fingers or toes look pale or bluish.     This information is not intended to replace advice given to you by your health care provider. Make sure you discuss any questions you have with your health care provider.     Document Released: 10/14/2007 Document Revised: 09/11/2014 Document Reviewed: 04/23/2014  Elsevier Interactive Patient Education ©2016 [employer]

## 2015-11-05 NOTE — ED Notes (Signed)
Pt states she a container of ice cream fell at [employer] and tore the skin on her LLE , pt has varicose veins noted.. Pt has bandage in place on arrival with controlled bleeding.

## 2015-11-05 NOTE — ED Provider Notes (Signed)
[facility] Emergency Department Provider Note   ____________________________________________  Time seen: Approximately 4:52 PM  I have reviewed the triage vital signs and the nursing notes.   HISTORY  Chief Complaint Extremity Laceration    HPI Amanda Carpenter is a 70 y.o. female patient with a laceration to the left lower leg. Patient stated containing ice cream fell on her leg at the store. Initial treatment done by the pharmacist insisted level pressure dressing to control bleeding. Patient denies loss of sensation or loss of function of the left lower extremity. Patient has a history of varicose veins. No veins involving this incident. She rates her pain discomfort as a 4/10. No other palliative measures for this complaint.Patient has fairly thin skin.   Past Medical History  Diagnosis Date  . GERD (gastroesophageal reflux disease)   . Allergy     Patient Active Problem List   Diagnosis Date Noted  . Allergic rhinitis 10/02/2014  . Arthritis 10/02/2014  . 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 Accumulation of fluid in tissues 10/02/2014  . Fatigue 10/02/2014  . Acid reflux 10/02/2014  . Bleeding gastrointestinal 10/02/2014  . 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 Hypercholesteremia 10/02/2014  . Adult hypothyroidism 10/02/2014  . Gonalgia 10/02/2014  . Adiposity 10/02/2014  . Osteopenia 10/02/2014  . Breath shortness 10/02/2014  . Skin thinning 10/02/2014  . Infection of urinary tract 10/02/2014  . 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 Avitaminosis D 10/02/2014  . 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 Borderline diabetes 08/14/2009    Past Surgical History  Procedure Laterality Date  . Cholecystectomy  1994    Dr. Bary Castilla  . 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 Knee arthroplasty Left 04/2014    Current Outpatient Rx  Name  Route  Sig  Dispense  Refill  .  cholecalciferol (VITAMIN D) 1000 units tablet   Oral   Take 1 tablet (1,000 Units total) by mouth daily.   90 tablet   3   . furosemide (LASIX) 20 MG tablet   Oral   Take 1 tablet (20 mg total) by mouth daily.   90 tablet   3   . omeprazole (PRILOSEC) 20 MG capsule   Oral   Take 1 capsule (20 mg total) by mouth 2 (two) times daily before a meal.   180 capsule   3   . traMADol (ULTRAM) 50 MG tablet   Oral   Take 1 tablet (50 mg total) by mouth every 4 (four) hours as needed.   100 tablet   5   . traMADol (ULTRAM) 50 MG tablet   Oral   Take 1 tablet (50 mg total) by mouth every 6 (six) hours as needed.   20 tablet   0     Allergies Review of patient's allergies indicates no known allergies.  No family history on file.  Social History Social History  Substance Use Topics  . Smoking status: Former Smoker    Quit date: 05/11/1985  . Smokeless tobacco: Never Used  . Alcohol Use: No    Review of Systems Constitutional: No fever/chills Eyes: No visual changes. ENT: No sore throat. Cardiovascular: Denies chest pain. Respiratory: Denies shortness of breath. Gastrointestinal: No abdominal pain.  No nausea, no vomiting.  No diarrhea.  No constipation. Genitourinary: Negative for dysuria. Musculoskeletal: Negative for back pain. Skin: Negative for rash.Laceration left lower leg Neurological: Negative for  headaches, focal weakness or numbness.  .  ____________________________________________   PHYSICAL EXAM:  VITAL SIGNS: ED Triage Vitals  Enc Vitals Group     BP 11/05/15 1628 143/65 mmHg     Pulse Rate 11/05/15 1628 73     Resp 11/05/15 1628 18     Temp 11/05/15 1628 98.9 F (37.2 C)     Temp Source 11/05/15 1628 Oral     SpO2 11/05/15 1628 96 %     Weight 11/05/15 1628 210 lb (95.255 kg)     Height 11/05/15 1628 5\' 3"  (1.6 m)     Head Cir --      Peak Flow --      Pain Score --      Pain Loc --      Pain Edu? --      Excl. in [place]? --      Constitutional: Alert and oriented. Well appearing and in no acute distress. Eyes: Conjunctivae are normal. PERRL. EOMI. Head: Atraumatic. Nose: No congestion/rhinnorhea. Mouth/Throat: Mucous membranes are moist.  Oropharynx non-erythematous. Neck: No stridor. No cervical spine tenderness to palpation. Hematological/Lymphatic/Immunilogical: No cervical lymphadenopathy. Cardiovascular: Normal rate, regular rhythm. Grossly normal heart sounds.  Good peripheral circulation. Respiratory: Normal respiratory effort.  No retractions. Lungs CTAB. Gastrointestinal: Soft and nontender. No distention. No abdominal bruits. No CVA tenderness. Musculoskeletal: No lower extremity tenderness nor edema.  No joint effusions. Neurologic:  Normal speech and language. No gross focal neurologic deficits are appreciated. No gait instability. Skin:  Skin is warm, dry and intact. No rash noted. Psychiatric: Mood and affect are normal. Speech and behavior are normal.  ____________________________________________   LABS (all labs ordered are listed, but only abnormal results are displayed)  Labs Reviewed - No data to display ____________________________________________  EKG   ____________________________________________  RADIOLOGY   ____________________________________________   PROCEDURES  Procedure(s) performed: LACERATION REPAIR Performed by: Sable Feil Authorized by: Sable Feil Consent: Verbal consent obtained. Risks and benefits: risks, benefits and alternatives were discussed Consent given by: patient Patient identity confirmed: provided demographic data Prepped and Draped in normal sterile fashion Wound explored  Laceration Location: Left lower leg  Laceration Length: 4 cm  No Foreign Bodies seen or palpated  Anesthesia: local infiltration  Local anesthetic: lidocaine 1% with epinephrine  Anesthetic total: 8 ml  Irrigation method: syringe Amount of cleaning:  standard  Skin closure: 4 nylon Number of sutures: Chest pain  Technique: Interrupted   Patient tolerance: Patient tolerated the procedure well with no immediate complications.   Critical Care performed: No  ____________________________________________   INITIAL IMPRESSION / ASSESSMENT AND PLAN / ED COURSE  Pertinent labs & imaging results that were available during my care of the patient were reviewed by me and considered in my medical decision making (see chart for details).  Laceration left lower leg. Patient given discharge care instructions. Patient given a prescription for tramadol. Patient advised to have sutures removed in 10 days by [facility] doctor. Patient advised return by ER if he opens before complete healing process. ____________________________________________   FINAL CLINICAL IMPRESSION(S) / ED DIAGNOSES  Final diagnoses:  Leg laceration, left, initial encounter      NEW MEDICATIONS STARTED DURING THIS VISIT:  New Prescriptions   TRAMADOL (ULTRAM) 50 MG TABLET    Take 1 tablet (50 mg total) by mouth every 6 (six) hours as needed.     Note:  This document was prepared using Dragon voice recognition software and may include unintentional dictation errors.  Sable Feil, PA-C 11/05/15 1756  Schuyler Amor, MD 11/05/15 [phone]

## 2015-11-07 ENCOUNTER — Ambulatory Visit (INDEPENDENT_AMBULATORY_CARE_PROVIDER_SITE_OTHER): Payer: PPO | Admitting: Family Medicine

## 2015-11-07 ENCOUNTER — Telehealth: Payer: Self-pay | Admitting: Family Medicine

## 2015-11-07 VITALS — BP 98/56 | HR 72 | Temp 98.4°F | Resp 18

## 2015-11-07 DIAGNOSIS — L03116 Cellulitis of left lower limb: Secondary | ICD-10-CM | POA: Diagnosis not present

## 2015-11-07 MED ORDER — CEPHALEXIN 500 MG PO CAPS: 500.0000 mg | ORAL_CAPSULE | Freq: Two times a day (BID) | ORAL | Status: DC

## 2015-11-07 NOTE — Telephone Encounter (Signed)
Pt advised per dr Rosanna Randy to come now-aa

## 2015-11-07 NOTE — Telephone Encounter (Signed)
Pt had 16 stiches in her leg on 11/05/15 and she wants Dr. Rosanna Randy to look at it today b/c she thinks it might be infected. The area is swollen and red. There is a bloody drainage coming out of the area. Pt doesn't want to see a PA. Can the pt be worked in? Please advise. Thanks TNP

## 2015-11-07 NOTE — Progress Notes (Signed)
Subjective:  HPI  Patient went to ER and had 16 stitches put in her leg on 11/05/15 due to laceration to the left lower leg. She bathed last night and was careful with patting the area but had some blood on the towel last night and some blood on the bandaid this morning. The area is red, tender. She was advised to have her stitches taking out on July 7th=-next Friday. No fever or chills but she has felt nauseas.   Prior to Admission medications   Medication Sig Start Date End Date Taking? Authorizing Provider  cholecalciferol (VITAMIN D) 1000 units tablet Take 1 tablet (1,000 Units total) by mouth daily. 06/03/15   Richard Maceo Pro., MD  furosemide (LASIX) 20 MG tablet Take 1 tablet (20 mg total) by mouth daily. 06/03/15   Richard Maceo Pro., MD  omeprazole (PRILOSEC) 20 MG capsule Take 1 capsule (20 mg total) by mouth 2 (two) times daily before a meal. 06/03/15   Jerrol Banana., MD  traMADol (ULTRAM) 50 MG tablet Take 1 tablet (50 mg total) by mouth every 4 (four) hours as needed. 03/19/15   Richard Maceo Pro., MD  traMADol (ULTRAM) 50 MG tablet Take 1 tablet (50 mg total) by mouth every 6 (six) hours as needed. 11/05/15 11/04/16  Sable Feil, PA-C    Patient Active Problem List   Diagnosis Date Noted  . Allergic rhinitis 10/02/2014  . Arthritis 10/02/2014  . 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 Accumulation of fluid in tissues 10/02/2014  . Fatigue 10/02/2014  . Acid reflux 10/02/2014  . Bleeding gastrointestinal 10/02/2014  . 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 Hypercholesteremia 10/02/2014  . Adult hypothyroidism 10/02/2014  . Gonalgia 10/02/2014  . Adiposity 10/02/2014  . Osteopenia 10/02/2014  . Breath shortness 10/02/2014  . Skin thinning 10/02/2014  . Infection of urinary tract 10/02/2014  . 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 Avitaminosis D 10/02/2014  . 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  Borderline diabetes 08/14/2009    Past Medical History  Diagnosis Date  . GERD (gastroesophageal reflux disease)   . Allergy     Social History   Social History  . Marital Status: Married    Spouse Name: Deidre Ala  . Number of Children: 2  . Years of Education: N/A   Occupational History  . Not on file.   Social History Main Topics  . Smoking status: Former Smoker    Quit date: 05/11/1985  . Smokeless tobacco: Never Used  . Alcohol Use: No  . Drug Use: No  . Sexual Activity: Not on file   Other Topics Concern  . Not on file   Social History Narrative    No Known Allergies  Review of Systems  Constitutional: Negative.   Eyes: Negative.   Respiratory: Negative.   Cardiovascular: Negative.   Musculoskeletal: Positive for myalgias and joint pain.  Skin:       Stitches put in the lower left leg. Skin around the area is red  Neurological: Negative.   Endo/Heme/Allergies: Negative.   Psychiatric/Behavioral: Negative.     Immunization History  Administered Date(s) Administered  . Pneumococcal Conjugate-13 01/09/2015  . Pneumococcal Polysaccharide-23 11/02/2012  . Td 05/20/2005  . Tdap 02/16/2011   Objective:  BP 98/56 mmHg  Pulse 72  Temp(Src) 98.4 F (36.9 C)  Resp 18  Physical Exam  Constitutional: She is well-developed, well-nourished, and in no distress.  HENT:  Head: Normocephalic and atraumatic.  Cardiovascular: Normal rate and regular rhythm.   Pulmonary/Chest: Effort normal.  Skin: Skin is warm and dry. There is erythema.  Laceration of mid shin is sutured mild erythema which is getting worse per patient. No pus can be expressed. There is an M shaped laceration/suturing  Psychiatric: Mood, memory, affect and judgment normal.    Lab Results  Component Value Date   WBC 8.3 01/09/2015   HGB 13.6 01/09/2014   HCT 40.2 01/09/2015   PLT 308 01/09/2015   GLUCOSE 103* 01/09/2015   CHOL 223* 01/09/2015   TRIG 113 01/09/2015   HDL 58 01/09/2015    LDLCALC 142* 01/09/2015   TSH 3.080 01/09/2015   HGBA1C 5.7* 01/09/2015    CMP     Component Value Date/Time   NA 139 01/09/2015 1020   K 4.5 01/09/2015 1020   CL 100 01/09/2015 1020   CO2 25 01/09/2015 1020   GLUCOSE 103* 01/09/2015 1020   BUN 13 01/09/2015 1020   CREATININE 0.77 01/09/2015 1020   CREATININE 0.8 01/09/2014   CALCIUM 9.7 01/09/2015 1020   PROT 6.6 01/09/2015 1020   ALBUMIN 4.4 01/09/2015 1020   AST 16 01/09/2015 1020   ALT 11 01/09/2015 1020   ALKPHOS 128* 01/09/2015 1020   BILITOT 0.3 01/09/2015 1020   GFRNONAA 79 01/09/2015 1020   GFRAA 91 01/09/2015 1020    Assessment and Plan :  1. Cellulitis of left lower extremity Treat with Keflex. Will remove stitches in 1 week.  Patient was seen and examined by Dr. Eulas Post and note was scribed by Theressa Millard, RMA.    Miguel Aschoff MD [facility] 11/07/2015 2:33 PM

## 2015-11-14 ENCOUNTER — Encounter: Payer: Self-pay | Admitting: Family Medicine

## 2015-11-14 ENCOUNTER — Ambulatory Visit (INDEPENDENT_AMBULATORY_CARE_PROVIDER_SITE_OTHER): Payer: PPO | Admitting: Family Medicine

## 2015-11-14 VITALS — BP 112/72 | HR 68 | Temp 98.3°F | Resp 16 | Wt 222.0 lb

## 2015-11-14 DIAGNOSIS — Z4802 Encounter for removal of sutures: Secondary | ICD-10-CM | POA: Diagnosis not present

## 2015-11-14 DIAGNOSIS — L03116 Cellulitis of left lower limb: Secondary | ICD-10-CM | POA: Diagnosis not present

## 2015-11-14 NOTE — Progress Notes (Signed)
       Patient: Amanda Carpenter Female    DOB: January 21, 1946   70 y.o.   MRN: [id number] Visit Date: 11/14/2015  Today's Provider: Wilhemena Durie, MD   Chief Complaint  Patient presents with  . Suture / Staple Removal  . Cellulitis   Subjective:    HPI   Suture Removal Pt had 16 stitches placed on LLE on 11/05/2015. Pt needs to have those removed today.   Follow up for Cellulitis  The patient was last seen for this 1 week ago. Changes made at last visit include adding Keflex.  She reports excellent compliance with treatment. She feels that condition is Unchanged. Still has redness, tenderness. Denies drainage, warmth and smell. She is having side effects. Dry mouth. Pt finished abx this morning.  ------------------------------------------------------------------------------------     No Known Allergies Current Meds  Medication Sig  . cholecalciferol (VITAMIN D) 1000 units tablet Take 1 tablet (1,000 Units total) by mouth daily.  Marland Kitchen omeprazole (PRILOSEC) 20 MG capsule Take 1 capsule (20 mg total) by mouth 2 (two) times daily before a meal.  . traMADol (ULTRAM) 50 MG tablet Take 1 tablet (50 mg total) by mouth every 6 (six) hours as needed.    Review of Systems  Constitutional: Negative for fever, chills, diaphoresis, activity change, appetite change, fatigue and unexpected weight change.  Skin: Positive for wound.    Social History  Substance Use Topics  . Smoking status: Former Smoker    Quit date: 05/11/1985  . Smokeless tobacco: Never Used  . Alcohol Use: No   Objective:   BP 112/72 mmHg  Pulse 68  Temp(Src) 98.3 F (36.8 C) (Oral)  Resp 16  Wt 222 lb (100.699 kg)  Physical Exam  Constitutional: She is oriented to person, place, and time. She appears well-developed and well-nourished.  Neurological: She is alert and oriented to person, place, and time.  Skin: Lesion noted. There is erythema (located on LLE).  Erythematous around wound but less  tender,no induration,no drainage of pus.  Psychiatric: She has a normal mood and affect. Her behavior is normal.        Assessment & Plan:     1. Visit for suture removal 16 sutures removed without adverse reaction. Pt tolerated procedure well.  2. Cellulitis of leg, left Just finished Keflex this morning. Pt to monitor leg, and call if worsens.     Patient seen and examined by Miguel Aschoff, MD, and note scribed by Renaldo Fiddler, CMA.  I have done the exam and reviewed the above chart and it is accurate to the best of my knowledge.  Sumaya Riedesel Cranford Mon, MD  [facility]

## 2015-11-28 ENCOUNTER — Ambulatory Visit: Payer: Self-pay | Admitting: Family Medicine

## 2016-01-23 ENCOUNTER — Encounter: Payer: Self-pay | Admitting: Physician Assistant

## 2016-01-23 ENCOUNTER — Ambulatory Visit (INDEPENDENT_AMBULATORY_CARE_PROVIDER_SITE_OTHER): Payer: PPO | Admitting: Physician Assistant

## 2016-01-23 VITALS — BP 120/60 | HR 67 | Temp 98.7°F | Resp 16 | Ht 63.0 in | Wt 224.2 lb

## 2016-01-23 DIAGNOSIS — M8580 Other specified disorders of bone density and structure, unspecified site: Secondary | ICD-10-CM | POA: Diagnosis not present

## 2016-01-23 DIAGNOSIS — K130 Diseases of lips: Secondary | ICD-10-CM

## 2016-01-23 DIAGNOSIS — L03116 Cellulitis of left lower limb: Secondary | ICD-10-CM

## 2016-01-23 DIAGNOSIS — E7800 Pure hypercholesterolemia, unspecified: Secondary | ICD-10-CM | POA: Diagnosis not present

## 2016-01-23 DIAGNOSIS — R7303 Prediabetes: Secondary | ICD-10-CM | POA: Diagnosis not present

## 2016-01-23 DIAGNOSIS — Z0000 Encounter for general adult medical examination without abnormal findings: Secondary | ICD-10-CM

## 2016-01-23 DIAGNOSIS — Z1159 Encounter for screening for other viral diseases: Secondary | ICD-10-CM | POA: Diagnosis not present

## 2016-01-23 DIAGNOSIS — Z1239 Encounter for other screening for malignant neoplasm of breast: Secondary | ICD-10-CM | POA: Diagnosis not present

## 2016-01-23 DIAGNOSIS — Z780 Asymptomatic menopausal state: Secondary | ICD-10-CM

## 2016-01-23 MED ORDER — KETOCONAZOLE 2 % EX CREA: 1.0000 "application " | TOPICAL_CREAM | Freq: Every day | CUTANEOUS | 0 refills | Status: DC

## 2016-01-23 MED ORDER — CEPHALEXIN 500 MG PO CAPS: 500.0000 mg | ORAL_CAPSULE | Freq: Two times a day (BID) | ORAL | 0 refills | Status: DC

## 2016-01-23 NOTE — Patient Instructions (Signed)

## 2016-01-23 NOTE — Progress Notes (Signed)
Patient: Amanda Carpenter, Female    DOB: 1946-01-21, 69 y.o.   MRN: [id number] Visit Date: 01/23/2016  Today's Provider: [name], PA-C   Chief Complaint  Patient presents with  . Medicare Wellness   Subjective:    Annual wellness visit Amanda Carpenter is a 70 y.o. female. She feels well. She reports exercising none. She reports she is sleeping well.  Patient is complaining of wound not healing properly on her left lower leg. She injured it in July at [facility] and had 16 stitches. She was placed initially on Kelfex, but has not had any antibiotic since. The wound is still present and has increasing redness surrounding. Not much pain. Warmth is present. Minimal discharge noted on overnight bandage. No foul smell.  She is also concern about the mouth sores she has off and on for the past 2-3 months. She reports using neosporin on them and they resolve for a short while but then return. It is cracks in the corners of the mouth.   Mammogram:03/21/14 BMD:03/12/14 some Osteopenia Colon:11/29/12-Hemorrhoids Endo 11/29/2012 Collagenous colitis and gastritis, chronic Pap:07/11/09-Normal -----------------------------------------------------------   Review of Systems  Constitutional: Negative.   HENT: Positive for mouth sores and tinnitus.   Eyes: Negative.   Respiratory: Negative.   Cardiovascular: Negative.   Gastrointestinal: Negative.   Endocrine: Negative.   Genitourinary: Negative.   Musculoskeletal: Negative.   Skin: Positive for wound.  Allergic/Immunologic: Negative.   Neurological: Negative.   Hematological: Bruises/bleeds easily.  Psychiatric/Behavioral: Negative.     Social History   Social History  . Marital status: Married    Spouse name: Deidre Ala  . Number of children: 2  . Years of education: N/A   Occupational History  . Not on file.   Social History Main Topics  . Smoking status: Former Smoker    Quit date: 05/11/1985  . Smokeless  tobacco: Never Used  . Alcohol use No  . Drug use: No  . Sexual activity: Not on file   Other Topics Concern  . Not on file   Social History Narrative  . No narrative on file    Past Medical History:  Diagnosis Date  . Allergy   . GERD (gastroesophageal reflux disease)      Patient Active Problem List   Diagnosis Date Noted  . Cellulitis of leg, left 11/14/2015  . Visit for suture removal 11/14/2015  . Allergic rhinitis 10/02/2014  . Arthritis 10/02/2014  . 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 Accumulation of fluid in tissues 10/02/2014  . Fatigue 10/02/2014  . Acid reflux 10/02/2014  . Bleeding gastrointestinal 10/02/2014  . 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 Hypercholesteremia 10/02/2014  . Adult hypothyroidism 10/02/2014  . Gonalgia 10/02/2014  . Adiposity 10/02/2014  . Osteopenia 10/02/2014  . Breath shortness 10/02/2014  . Skin thinning 10/02/2014  . Infection of urinary tract 10/02/2014  . 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 Avitaminosis D 10/02/2014  . 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 Borderline diabetes 08/14/2009    Past Surgical History:  Procedure Laterality Date  . CHOLECYSTECTOMY  1994   Dr. Bary Castilla  . KNEE ARTHROPLASTY Left 04/2014  . 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    No outpatient prescriptions have been marked as taking for the 01/23/16 encounter (Office Visit) with [name], PA-C.    Patient Care  Team: Jerrol Banana., MD as PCP - General (Family Medicine)    Objective:   Vitals: BP 120/60 (BP Location: Left Arm, Patient Position: Sitting, Cuff Size: Normal)   Pulse 67   Temp 98.7 F (37.1 C) (Oral)   Resp 16   Ht 5\' 3"  (1.6 m)   Wt 224 lb 3.2 oz (101.7 kg)   BMI 39.72 kg/m   Physical Exam  Constitutional: She is oriented to person, place, and time. She appears well-developed and  well-nourished. No distress.  HENT:  Head: Normocephalic and atraumatic.  Right Ear: Tympanic membrane, external ear and ear canal normal.  Left Ear: Tympanic membrane, external ear and ear canal normal.  Nose: Nose normal.  Mouth/Throat: Uvula is midline and oropharynx is clear and moist. Oral lesions present. No oropharyngeal exudate, posterior oropharyngeal edema or posterior oropharyngeal erythema.    Dry, red cracks in the corners of the mouth. No crusting.  Eyes: Conjunctivae and EOM are normal. Pupils are equal, round, and reactive to light. Right eye exhibits no discharge. Left eye exhibits no discharge. No scleral icterus.  Neck: Normal range of motion. Neck supple. No JVD present. No tracheal deviation present. No thyromegaly present.  Cardiovascular: Normal rate, regular rhythm, normal heart sounds and intact distal pulses.  Exam reveals no gallop and no friction rub.   No murmur heard. Pulmonary/Chest: Effort normal and breath sounds normal. No respiratory distress. She has no wheezes. She has no rales. She exhibits no tenderness.  Abdominal: Soft. Bowel sounds are normal. She exhibits no distension and no mass. There is no tenderness. There is no rebound and no guarding.  Musculoskeletal: Normal range of motion. She exhibits no edema or tenderness.  Lymphadenopathy:    She has no cervical adenopathy.  Neurological: She is alert and oriented to person, place, and time.  Skin: Skin is warm and dry. Laceration noted. No rash noted. She is not diaphoretic.     Psychiatric: She has a normal mood and affect. Her behavior is normal. Judgment and thought content normal.  Vitals reviewed.   Activities of Daily Living In your present state of health, do you have any difficulty performing the following activities: 01/23/2016  Hearing? Y  Vision? N  Difficulty concentrating or making decisions? Y  Walking or climbing stairs? Y  Dressing or bathing? N  Doing errands, shopping? N    Some recent data might be hidden    Fall Risk Assessment Fall Risk  01/23/2016 01/09/2015  Falls in the past year? No No     Depression Screen PHQ 2/9 Scores 01/23/2016 01/09/2015  PHQ - 2 Score 0 0    Cognitive Testing - 6-CIT  Correct? Score   What year is it? yes 0 0 or 4  What month is it? yes 0 0 or 3  Memorize:    Pia Mau,  41,  [place],      What time is it? (within 1 hour) yes 0 0 or 3  Count backwards from 20 yes 0 0, 2, or 4  Name the months of the year yes 0 0, 2, or 4  Repeat name & address above yes 0 0, 2, 4, 6, 8, or 10       TOTAL SCORE  0/28   Interpretation:  Normal  Normal (0-7) Abnormal (8-28)   Audit-C Alcohol Use Screening  Question Answer Points  How often do you have alcoholic drink? 1 times monthly 1  On days you  do drink alcohol, how many drinks do you typically consume? 0 0  How oftey will you drink 6 or more in a total? Less than once a month 1  Total Score:  2   A score of 3 or more in women, and 4 or more in men indicates increased risk for alcohol abuse, EXCEPT if all of the points are from question 1.     Assessment & Plan:     Annual Wellness Visit  Reviewed patient's Family Medical History Reviewed and updated list of patient's medical providers Assessment of cognitive impairment was done Assessed patient's functional ability Established a written schedule for health screening [place] Completed and Reviewed  Exercise Activities and Dietary recommendations Goals    . Exercise 150 minutes per week (moderate activity)       Immunization History  Administered Date(s) Administered  . Pneumococcal Conjugate-13 01/09/2015  . Pneumococcal Polysaccharide-23 11/02/2012  . Td 05/20/2005  . Tdap 02/16/2011    Health Maintenance  Topic Date Due  . Hepatitis C Screening  1945/11/30  . ZOSTAVAX  01/28/2005  . INFLUENZA VACCINE  06/03/2016 (Originally 12/10/2015)  . MAMMOGRAM  03/21/2016  .  TETANUS/TDAP  02/15/2021  . COLONOSCOPY  11/30/2022  . DEXA SCAN  Completed  . PNA vac Low Risk Adult  Completed      Discussed health benefits of physical activity, and encouraged her to engage in regular exercise appropriate for her age and condition.    1. Medicare annual wellness visit, subsequent Normal exam with exception of mouth and wound as noted.  2. Borderline diabetes Will check labs as below and f/u pending results. - Basic Metabolic Panel (BMET) - HgB A1c  3. Hypercholesteremia Will check labs as below and f/u pending results. - Lipid Profile  4. Cellulitis of leg, left Slow improvement now with increasing redness. Will give another round of keflex. She is to call if no improvement. - CBC w/Diff/Platelet - cephALEXin (KEFLEX) 500 MG capsule; Take 1 capsule (500 mg total) by mouth 2 (two) times daily.  Dispense: 14 capsule; Refill: 0  5. Angular cheilitis Antibacterial has not worked so will treat with antifungal as below. - ketoconazole (NIZORAL) 2 % cream; Apply 1 application topically daily. To affected areas.  Dispense: 15 g; Refill: 0  6. Breast cancer screening - MM Digital Screening; Future  7. Osteopenia H/O osteopenia. Only on Vit D supplement. No calcium. Will repeat BMD as below. F/U pending results.  - DG Bone Density; Future  8. Postmenopausal estrogen deficiency - DG Bone Density; Future  9. Need for hepatitis C screening test - Hepatitis C Antibody  ------------------------------------------------------------------------------------------------------------    [name], PA-C  [facility]

## 2016-01-24 ENCOUNTER — Telehealth: Payer: Self-pay

## 2016-01-24 LAB — CBC WITH DIFFERENTIAL/PLATELET
BASOS: 0 %
Basophils Absolute: 0 10*3/uL (ref 0.0–0.2)
EOS (ABSOLUTE): 0.1 10*3/uL (ref 0.0–0.4)
Eos: 2 %
Hematocrit: 39.6 % (ref 34.0–46.6)
Hemoglobin: 13.2 g/dL (ref 11.1–15.9)
IMMATURE GRANS (ABS): 0 10*3/uL (ref 0.0–0.1)
Immature Granulocytes: 0 %
LYMPHS: 30 %
Lymphocytes Absolute: 2.2 10*3/uL (ref 0.7–3.1)
MCH: 28 pg (ref 26.6–33.0)
MCHC: 33.3 g/dL (ref 31.5–35.7)
MCV: 84 fL (ref 79–97)
MONOS ABS: 0.6 10*3/uL (ref 0.1–0.9)
Monocytes: 9 %
NEUTROS ABS: 4.3 10*3/uL (ref 1.4–7.0)
Neutrophils: 59 %
PLATELETS: 285 10*3/uL (ref 150–379)
RBC: 4.71 x10E6/uL (ref 3.77–5.28)
RDW: 13.7 % (ref 12.3–15.4)
WBC: 7.2 10*3/uL (ref 3.4–10.8)

## 2016-01-24 LAB — BASIC METABOLIC PANEL
BUN / CREAT RATIO: 14 (ref 12–28)
BUN: 11 mg/dL (ref 8–27)
CALCIUM: 9.4 mg/dL (ref 8.7–10.3)
CHLORIDE: 101 mmol/L (ref 96–106)
CO2: 22 mmol/L (ref 18–29)
CREATININE: 0.8 mg/dL (ref 0.57–1.00)
GFR calc Af Amer: 86 mL/min/{1.73_m2} (ref >59)
GFR calc non Af Amer: 75 mL/min/{1.73_m2} (ref >59)
Glucose: 98 mg/dL (ref 65–99)
Potassium: 4.7 mmol/L (ref 3.5–5.2)
Sodium: 141 mmol/L (ref 134–144)

## 2016-01-24 LAB — LIPID PANEL
CHOLESTEROL TOTAL: 201 mg/dL — AB (ref 100–199)
Chol/HDL Ratio: 3.9 ratio units (ref 0.0–4.4)
HDL: 52 mg/dL (ref >39)
LDL CALC: 125 mg/dL — AB (ref 0–99)
TRIGLYCERIDES: 122 mg/dL (ref 0–149)
VLDL CHOLESTEROL CAL: 24 mg/dL (ref 5–40)

## 2016-01-24 LAB — HEMOGLOBIN A1C
ESTIMATED AVERAGE GLUCOSE: 120 mg/dL
Hgb A1c MFr Bld: 5.8 % — ABNORMAL HIGH (ref 4.8–5.6)

## 2016-01-24 LAB — HEPATITIS C ANTIBODY: Hep C Virus Ab: <0.1 s/co ratio (ref 0.0–0.9)

## 2016-01-24 NOTE — Telephone Encounter (Signed)
Tried calling patient and no answer. Will try again later.  

## 2016-01-24 NOTE — Telephone Encounter (Signed)
LMTCB 01/24/2016  Thanks,   -Mickel Baas

## 2016-01-24 NOTE — Telephone Encounter (Signed)
Pt advised.   Thanks,

## 2016-01-24 NOTE — Telephone Encounter (Signed)
-----   Message from [name], PA-C sent at 01/24/2016  8:40 AM EDT ----- All labs are fairly stable. Cholesterol has improved but is still borderline elevated. HgBA1c increased just minimally from 5.7 to 5.8. Continue working on lifestyle modifications and really try to limit sugars and carbs. We will recheck in one year.

## 2016-02-25 ENCOUNTER — Ambulatory Visit: Payer: PPO | Attending: Physician Assistant

## 2016-03-17 NOTE — Progress Notes (Signed)
Pt is being scheduled for preop appt; please place surgical orders in epic. Thanks.  

## 2016-04-07 NOTE — Progress Notes (Signed)
Surgery on 12/11.  Preop on 04/10/16.  Need orders in EPIC Thank You

## 2016-04-08 ENCOUNTER — Other Ambulatory Visit (HOSPITAL_COMMUNITY): Payer: Self-pay | Admitting: *Deleted

## 2016-04-08 ENCOUNTER — Encounter (HOSPITAL_COMMUNITY): Payer: Self-pay

## 2016-04-08 NOTE — Patient Instructions (Signed)
Amanda Carpenter  04/08/2016   Your procedure is scheduled on: 04-20-16  Report to [facility] Main  Entrance take [place]  elevators to 3rd floor to  [place] at 515 AM.  Call this number if you have problems the morning of surgery [phone]   Remember: ONLY 1 PERSON MAY GO WITH YOU TO SHORT STAY TO GET  READY MORNING OF [place].  Do not eat food or drink liquids :After Midnight.     Take these medicines the morning of surgery with A SIP OF WATER: OMEPRAZOLE (PRILOSEC)                               You may not have any metal on your body including hair pins and              piercings  Do not wear jewelry, make-up, lotions, powders or perfumes, deodorant             Do not wear nail polish.  Do not shave  48 hours prior to surgery.              Men may shave face and neck.   Do not bring valuables to the hospital. [place].  Contacts, dentures or bridgework may not be worn into surgery.  Leave suitcase in the car. After surgery it may be brought to your room.                  Please read over the following fact sheets you were given: _____________________________________________________________________             [facility] - Preparing for Surgery Before surgery, you can play an important role.  Because skin is not sterile, your skin needs to be as free of germs as possible.  You can reduce the number of germs on your skin by washing with CHG (chlorahexidine gluconate) soap before surgery.  CHG is an antiseptic cleaner which kills germs and bonds with the skin to continue killing germs even after washing. Please DO NOT use if you have an allergy to CHG or antibacterial soaps.  If your skin becomes reddened/irritated stop using the CHG and inform your nurse when you arrive at Short Stay. Do not shave (including legs and underarms) for at least 48 hours prior to the first CHG shower.  You may  shave your face/neck. Please follow these instructions carefully:  1.  Shower with CHG Soap the night before surgery and the  morning of Surgery.  2.  If you choose to wash your hair, wash your hair first as usual with your  normal  shampoo.  3.  After you shampoo, rinse your hair and body thoroughly to remove the  shampoo.                           4.  Use CHG as you would any other liquid soap.  You can apply chg directly  to the skin and wash                       Gently with a scrungie or clean washcloth.  5.  Apply the CHG Soap to  your body ONLY FROM THE NECK DOWN.   Do not use on face/ open                           Wound or open sores. Avoid contact with eyes, ears mouth and genitals (private parts).                       Wash face,  Genitals (private parts) with your normal soap.             6.  Wash thoroughly, paying special attention to the area where your surgery  will be performed.  7.  Thoroughly rinse your body with warm water from the neck down.  8.  DO NOT shower/wash with your normal soap after using and rinsing off  the CHG Soap.                9.  Pat yourself dry with a clean towel.            10.  Wear clean pajamas.            11.  Place clean sheets on your bed the night of your first shower and do not  sleep with pets. Day of Surgery : Do not apply any lotions/deodorants the morning of surgery.  Please wear clean clothes to the hospital/surgery center.  FAILURE TO FOLLOW THESE INSTRUCTIONS MAY RESULT IN THE CANCELLATION OF YOUR SURGERY PATIENT SIGNATURE_________________________________  NURSE SIGNATURE__________________________________  ________________________________________________________________________   [name]  An incentive spirometer is a tool that can help keep your lungs clear and active. This tool measures how well you are filling your lungs with each breath. Taking long deep breaths may help reverse or decrease the chance of developing  breathing (pulmonary) problems (especially infection) following:  A long period of time when you are unable to move or be active. BEFORE THE PROCEDURE   If the spirometer includes an indicator to show your best effort, your nurse or respiratory therapist will set it to a desired goal.  If possible, sit up straight or lean slightly forward. Try not to slouch.  Hold the incentive spirometer in an upright position. INSTRUCTIONS FOR USE  1. Sit on the edge of your bed if possible, or sit up as far as you can in bed or on a chair. 2. Hold the incentive spirometer in an upright position. 3. Breathe out normally. 4. Place the mouthpiece in your mouth and seal your lips tightly around it. 5. Breathe in slowly and as deeply as possible, raising the piston or the ball toward the top of the column. 6. Hold your breath for 3-5 seconds or for as long as possible. Allow the piston or ball to fall to the bottom of the column. 7. Remove the mouthpiece from your mouth and breathe out normally. 8. Rest for a few seconds and repeat Steps 1 through 7 at least 10 times every 1-2 hours when you are awake. Take your time and take a few normal breaths between deep breaths. 9. The spirometer may include an indicator to show your best effort. Use the indicator as a goal to work toward during each repetition. 10. After each set of 10 deep breaths, practice coughing to be sure your lungs are clear. If you have an incision (the cut made at the time of surgery), support your incision when coughing by placing a pillow or rolled up towels firmly against  it. Once you are able to get out of bed, walk around indoors and cough well. You may stop using the incentive spirometer when instructed by your caregiver.  RISKS AND COMPLICATIONS  Take your time so you do not get dizzy or light-headed.  If you are in pain, you may need to take or ask for pain medication before doing incentive spirometry. It is harder to take a deep  breath if you are having pain. AFTER USE  Rest and breathe slowly and easily.  It can be helpful to keep track of a log of your progress. Your caregiver can provide you with a simple table to help with this. If you are using the spirometer at home, follow these instructions: [place] IF:   You are having difficultly using the spirometer.  You have trouble using the spirometer as often as instructed.  Your pain medication is not giving enough relief while using the spirometer.  You develop fever of 100.5 F (38.1 C) or higher. SEEK IMMEDIATE MEDICAL CARE IF:   You cough up bloody sputum that had not been present before.  You develop fever of 102 F (38.9 C) or greater.  You develop worsening pain at or near the incision site. MAKE SURE YOU:   Understand these instructions.  Will watch your condition.  Will get help right away if you are not doing well or get worse. Document Released: 09/07/2006 Document Revised: 07/20/2011 Document Reviewed: 11/08/2006 ExitCare Patient Information 2014 ExitCare, [place].   ________________________________________________________________________  WHAT IS A BLOOD TRANSFUSION? Blood Transfusion Information  A transfusion is the replacement of blood or some of its parts. Blood is made up of multiple cells which provide different functions.  Red blood cells carry oxygen and are used for blood loss replacement.  White blood cells fight against infection.  Platelets control bleeding.  Plasma helps clot blood.  Other blood products are available for specialized needs, such as hemophilia or other clotting disorders. BEFORE THE TRANSFUSION  Who gives blood for transfusions?   Healthy volunteers who are fully evaluated to make sure their blood is safe. This is blood bank blood. Transfusion therapy is the safest it has ever been in the practice of medicine. Before blood is taken from a donor, a complete history is taken to make sure  that person has no history of diseases nor engages in risky social behavior (examples are intravenous drug use or sexual activity with multiple partners). The donor's travel history is screened to minimize risk of transmitting infections, such as malaria. The donated blood is tested for signs of infectious diseases, such as HIV and hepatitis. The blood is then tested to be sure it is compatible with you in order to minimize the chance of a transfusion reaction. If you or a relative donates blood, this is often done in anticipation of surgery and is not appropriate for emergency situations. It takes many days to process the donated blood. RISKS AND COMPLICATIONS Although transfusion therapy is very safe and saves many lives, the main dangers of transfusion include:   Getting an infectious disease.  Developing a transfusion reaction. This is an allergic reaction to something in the blood you were given. Every precaution is taken to prevent this. The decision to have a blood transfusion has been considered carefully by your caregiver before blood is given. Blood is not given unless the benefits outweigh the risks. AFTER THE TRANSFUSION  Right after receiving a blood transfusion, you will usually feel much better and more  energetic. This is especially true if your red blood cells have gotten low (anemic). The transfusion raises the level of the red blood cells which carry oxygen, and this usually causes an energy increase.  The nurse administering the transfusion will monitor you carefully for complications. HOME CARE INSTRUCTIONS  No special instructions are needed after a transfusion. You may find your energy is better. Speak with your caregiver about any limitations on activity for underlying diseases you may have. SEEK MEDICAL CARE IF:   Your condition is not improving after your transfusion.  You develop redness or irritation at the intravenous (IV) site. SEEK IMMEDIATE MEDICAL CARE IF:  Any of  the following symptoms occur over the next 12 hours:  Shaking chills.  You have a temperature by mouth above 102 F (38.9 C), not controlled by medicine.  Chest, back, or muscle pain.  People around you feel you are not acting correctly or are confused.  Shortness of breath or difficulty breathing.  Dizziness and fainting.  You get a rash or develop hives.  You have a decrease in urine output.  Your urine turns a dark color or changes to pink, red, or brown. Any of the following symptoms occur over the next 10 days:  You have a temperature by mouth above 102 F (38.9 C), not controlled by medicine.  Shortness of breath.  Weakness after normal activity.  The white part of the eye turns yellow (jaundice).  You have a decrease in the amount of urine or are urinating less often.  Your urine turns a dark color or changes to pink, red, or brown. Document Released: 04/24/2000 Document Revised: 07/20/2011 Document Reviewed: 12/12/2007 [facility] Patient Information 2014 [place], [place].  _______________________________________________________________________

## 2016-04-09 ENCOUNTER — Ambulatory Visit: Payer: Self-pay | Admitting: Orthopedic Surgery

## 2016-04-09 NOTE — H&P (Signed)
Amanda Carpenter DOB: 18-Mar-1946 Married / Language: English / Race: White Female Date of Admission:  04/20/2016 CC:  Right Knee Pain History of Present Illness The patient is a 70 year old female who comes in  for a preoperative History and Physical. The patient is scheduled for a right total knee arthroplasty to be performed by Dr. Dione Plover. Aluisio, MD at [facility] on 04-20-2016. The patient is a 70 year old female who presented with knee complaints. The patient reports right knee symptoms including: pain (weightbearing) which began year(s) ago without any known injury. The patient describes their pain as aching.The patient feels that the symptoms are worsening. The patient has the current diagnosis of knee osteoarthritis. Past treatment for this problem has included intra-articular injection of corticosteroids (right knee Flexogenix Jan.2017.). Symptoms are reported to be located in the left knee and left medial knee and include knee pain. Current treatment includes nonsteroidal anti-inflammatory drugs (Tramadol). Amanda Carpenter was seen as a new patient by Dr. Wynelle Carpenter for surgical evaluation of the right knee. She has been told that she has bone-on-bone arthritis in the outside of the right knee which would need surgrey. She is S/P Left Total Knee Arthroplasty from December 2015 which was performed at [facility] by Dr. Nilsa Carpenter. She did very well following her surgery and now the left leg is her good strong leg. The right knee got worse and she was going to get it operated on by Dr. Claiborne Carpenter however, due to an insurance change, she was unable to go back to [facility] and was referred over to Dr. Wynelle Carpenter. She lives in [place] and has work in the [employer] most of her life. Her right knee is tolerable as far as pain which is mainly activity related but it has interferred with her work. She denies any locking, popping, or buckling with the knee. She does get sedintary stiffness  and occasionally pain at night. She does have some discomfort with steps but able to get in and out of her Amanda Carpenter without difficulty. Injections have helped in the past and her most recent cortisone which was done in January of this year is still helping at this time. She has not tired any POTC meds except for Ibuprofen whihc caused a collagenous colitis. She is ready to get the knee fixed and is ready to proceed with surgery on the right knee at this time. The right knee has gotten progressively worse over time with regards to function. She said the pain occurs with activity, not at rest, although the pain is troublesome, the function is what is giving her the most problem. She has had the left knee replaced and did great with that. Insurance is changed, thus she could not go back to [place]. She is ready to proceed with surgery.   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 Heart Disease  Father, Paternal Grandfather.  Social History Children  2 Current drinker  08/09/2015: Currently drinks wine only occasionally per week Current work status  working full time Exercise  Exercises daily; does other Living situation  live with spouse Marital status  married No history of drug/alcohol rehab  Not under pain contract  Number of flights of stairs before winded  1 Tobacco /  smoke exposure  08/09/2015: no Tobacco use  Former smoker. 08/09/2015: smoke(d) 1 pack(s) per day  Medication History  Omeprazole (20MG  Capsule DR, Oral) Active. Vitamin D (Oral) Specific strength unknown - Active.   Past Surgical History  Gallbladder Surgery  laporoscopic Total Knee Replacement  Date: 2015. left   Review of  Systems General Not Present- Chills, Fatigue, Fever, Memory Loss, Night Sweats, Weight Gain and Weight Loss. Skin Not Present- Eczema, Hives, Itching, Lesions and Rash. HEENT Present- Tinnitus. Not Present- Dentures, Double Vision, Headache, Hearing Loss and Visual Loss. Respiratory Not Present- Allergies, Chronic Cough, Coughing up blood, Shortness of breath at rest and Shortness of breath with exertion. Cardiovascular Present- Swelling. Not Present- Chest Pain, Difficulty Breathing Lying Down, Murmur, Palpitations and Racing/skipping heartbeats. Gastrointestinal Not Present- Abdominal Pain, Bloody Stool, Constipation, Diarrhea, Difficulty Swallowing, Heartburn, Jaundice, Loss of appetitie, Nausea and Vomiting. Female Genitourinary Not Present- Blood in Urine, Discharge, Flank Pain, Incontinence, Painful Urination, Urgency, Urinary frequency, Urinary Retention, Urinating at Night and Weak urinary stream. Musculoskeletal Present- Joint Pain and Joint Swelling. Not Present- Back Pain, Morning Stiffness, Muscle Pain, Muscle Weakness and Spasms. Neurological Not Present- Blackout spells, Difficulty with balance, Dizziness, Paralysis, Tremor and Weakness. Psychiatric Not Present- Insomnia.  Vitals Weight: 220 lb Height: 64in Weight was reported by patient. Height was reported by patient. Body Surface Area: 2.04 m Body Mass Index: 37.76 kg/m  Pulse: 64 (Regular)  BP: 138/72 (Sitting, Left Arm, Standard)   Physical Exam  General Mental Status -Alert, cooperative and good historian. General Appearance-pleasant, Not in acute distress. Orientation-Oriented X3. Build & Nutrition-Well nourished and Well developed.  Head and Neck Head-normocephalic, atraumatic . Neck Global Assessment - supple, no bruit auscultated on the right, no bruit auscultated on the left.  Eye Vision-Wears corrective lenses(readers). Pupil - Bilateral-Regular and Round. Motion -  Bilateral-EOMI.  Chest and Lung Exam Auscultation Breath sounds - clear at anterior chest wall and clear at posterior chest wall. Adventitious sounds - No Adventitious sounds.  Cardiovascular Auscultation Rhythm - Regular rate and rhythm. Heart Sounds - S1 WNL and S2 WNL. Murmurs & Other Heart Sounds - Auscultation of the heart reveals - No Murmurs.  Abdomen Inspection Contour - Generalized mild distention. Palpation/Percussion Tenderness - Abdomen is non-tender to palpation. Rigidity (guarding) - Abdomen is soft. Auscultation Auscultation of the abdomen reveals - Bowel sounds normal.  Female Genitourinary Note: Not done, not pertinent to present illness   Musculoskeletal Note: She is alert and oriented, no apparent distress. Her hips show normal range of motion with no discomfort. Her left knee shows excellent alignment. Range of motion about 0 to 120. There is no tenderness or instability about the left knee. Her right knee shows significant valgus deformity. Range of motion in the right knee is about 5 to 130. There is marked crepitus on range of motion of the right knee, some tenderness lateral greater than medial with no instability. Pulse, sensation and motor intact. She has significantly antalgic gait with a valgus thrust on the right.  RADIOGRAPHS AP both knees, lateral of the right shows prosthesis on the left in good position, no abnormalities. On the right, she has got bone on bone arthritis lateral to about a 10-degree valgus deformity. She also has patellofemoral bone on bone.  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 Rosanna Randy  Topical TXA  Anesthesia Issues: None  Signed electronically by Ok Edwards, III PA-

## 2016-04-10 ENCOUNTER — Encounter (HOSPITAL_COMMUNITY)
Admission: RE | Admit: 2016-04-10 | Discharge: 2016-04-10 | Disposition: A | Discharge status: Home / Self Care | Payer: PPO | Source: Ambulatory Visit | Attending: Orthopedic Surgery | Admitting: Orthopedic Surgery

## 2016-04-10 ENCOUNTER — Encounter (HOSPITAL_COMMUNITY): Payer: Self-pay

## 2016-04-10 DIAGNOSIS — Z01812 Encounter for preprocedural laboratory examination: Secondary | ICD-10-CM | POA: Insufficient documentation

## 2016-04-10 HISTORY — DX: Unspecified osteoarthritis, unspecified site: M19.90

## 2016-04-10 HISTORY — DX: Other specified soft tissue disorders: M79.89

## 2016-04-10 HISTORY — DX: Malignant (primary) neoplasm, unspecified: C80.1

## 2016-04-10 LAB — APTT: aPTT: 36 seconds (ref 24–36)

## 2016-04-10 LAB — CBC
HEMATOCRIT: 40.3 % (ref 36.0–46.0)
HEMOGLOBIN: 13.1 g/dL (ref 12.0–15.0)
MCH: 27.6 pg (ref 26.0–34.0)
MCHC: 32.5 g/dL (ref 30.0–36.0)
MCV: 84.8 fL (ref 78.0–100.0)
Platelets: 257 10*3/uL (ref 150–400)
RBC: 4.75 MIL/uL (ref 3.87–5.11)
RDW: 14.2 % (ref 11.5–15.5)
WBC: 7.5 10*3/uL (ref 4.0–10.5)

## 2016-04-10 LAB — COMPREHENSIVE METABOLIC PANEL
ALK PHOS: 98 U/L (ref 38–126)
ALT: 19 U/L (ref 14–54)
ANION GAP: 8 (ref 5–15)
AST: 21 U/L (ref 15–41)
Albumin: 3.9 g/dL (ref 3.5–5.0)
BILIRUBIN TOTAL: 0.6 mg/dL (ref 0.3–1.2)
BUN: 13 mg/dL (ref 6–20)
CALCIUM: 9 mg/dL (ref 8.9–10.3)
CO2: 25 mmol/L (ref 22–32)
CREATININE: 0.76 mg/dL (ref 0.44–1.00)
Chloride: 105 mmol/L (ref 101–111)
Glucose, Bld: 90 mg/dL (ref 65–99)
Potassium: 4.3 mmol/L (ref 3.5–5.1)
Sodium: 138 mmol/L (ref 135–145)
TOTAL PROTEIN: 7 g/dL (ref 6.5–8.1)

## 2016-04-10 LAB — URINALYSIS, ROUTINE W REFLEX MICROSCOPIC
Bilirubin Urine: NEGATIVE
GLUCOSE, UA: NEGATIVE mg/dL
HGB URINE DIPSTICK: NEGATIVE
Ketones, ur: NEGATIVE mg/dL
LEUKOCYTES UA: NEGATIVE
Nitrite: NEGATIVE
Protein, ur: NEGATIVE mg/dL
Specific Gravity, Urine: 1.01 (ref 1.005–1.030)
pH: 5.5 (ref 5.0–8.0)

## 2016-04-10 LAB — PROTIME-INR
INR: 0.96
Prothrombin Time: 12.8 seconds (ref 11.4–15.2)

## 2016-04-10 LAB — SURGICAL PCR SCREEN
MRSA, PCR: NEGATIVE
Staphylococcus aureus: POSITIVE — AB

## 2016-04-10 LAB — ABO/RH: ABO/RH(D): O POS

## 2016-04-16 ENCOUNTER — Ambulatory Visit
Admission: RE | Admit: 2016-04-16 | Discharge: 2016-04-16 | Disposition: A | Discharge status: Home / Self Care | Payer: PPO | Source: Ambulatory Visit | Attending: Physician Assistant | Admitting: Physician Assistant

## 2016-04-16 DIAGNOSIS — M85851 Other specified disorders of bone density and structure, right thigh: Secondary | ICD-10-CM | POA: Diagnosis not present

## 2016-04-16 DIAGNOSIS — Z1231 Encounter for screening mammogram for malignant neoplasm of breast: Secondary | ICD-10-CM | POA: Diagnosis not present

## 2016-04-16 DIAGNOSIS — M8580 Other specified disorders of bone density and structure, unspecified site: Secondary | ICD-10-CM | POA: Diagnosis not present

## 2016-04-16 DIAGNOSIS — Z13820 Encounter for screening for osteoporosis: Secondary | ICD-10-CM | POA: Diagnosis not present

## 2016-04-16 DIAGNOSIS — Z780 Asymptomatic menopausal state: Secondary | ICD-10-CM | POA: Insufficient documentation

## 2016-04-16 DIAGNOSIS — Z1239 Encounter for other screening for malignant neoplasm of breast: Secondary | ICD-10-CM

## 2016-04-19 NOTE — Anesthesia Preprocedure Evaluation (Addendum)
Anesthesia Evaluation  Patient identified by MRN, date of birth, ID band Patient awake    Reviewed: Allergy & Precautions, NPO status , Patient's Chart, lab work & pertinent test results  Airway Mallampati: II  TM Distance: >3 FB Neck ROM: Full    Dental  (+) Dental Advisory Given   Pulmonary former smoker,    breath sounds clear to auscultation       Cardiovascular negative cardio ROS   Rhythm:Regular Rate:Normal     Neuro/Psych negative neurological ROS     GI/Hepatic Neg liver ROS, GERD  ,  Endo/Other  Morbid obesity  Renal/GU negative Renal ROS     Musculoskeletal  (+) Arthritis ,   Abdominal   Peds  Hematology   Anesthesia Other Findings   Reproductive/Obstetrics                            Lab Results  Component Value Date   WBC 7.5 04/10/2016   HGB 13.1 04/10/2016   HCT 40.3 04/10/2016   MCV 84.8 04/10/2016   PLT 257 04/10/2016   Lab Results  Component Value Date   CREATININE 0.76 04/10/2016   BUN 13 04/10/2016   NA 138 04/10/2016   K 4.3 04/10/2016   CL 105 04/10/2016   CO2 25 04/10/2016   Lab Results  Component Value Date   INR 0.96 04/10/2016    Anesthesia Physical Anesthesia Plan  ASA: III  Anesthesia Plan: MAC and Spinal   Post-op Pain Management:    Induction: Intravenous  Airway Management Planned: Simple Face Mask and Natural Airway  Additional Equipment:   Intra-op Plan:   Post-operative Plan:   Informed Consent: I have reviewed the patients History and Physical, chart, labs and discussed the procedure including the risks, benefits and alternatives for the proposed anesthesia with the patient or authorized representative who has indicated his/her understanding and acceptance.     Plan Discussed with: CRNA  Anesthesia Plan Comments:        Anesthesia Quick Evaluation

## 2016-04-20 ENCOUNTER — Encounter (HOSPITAL_COMMUNITY)
Admission: RE | Disposition: A | Discharge status: Home Health | Payer: Self-pay | Source: Ambulatory Visit | Attending: Orthopedic Surgery

## 2016-04-20 ENCOUNTER — Inpatient Hospital Stay (HOSPITAL_COMMUNITY)
Admission: RE | Admit: 2016-04-20 | Discharge: 2016-04-22 | DRG: 470 | Disposition: A | Discharge status: Home Health | Payer: PPO | Source: Ambulatory Visit | Attending: Orthopedic Surgery | Admitting: Orthopedic Surgery

## 2016-04-20 ENCOUNTER — Inpatient Hospital Stay (HOSPITAL_COMMUNITY): Payer: PPO | Admitting: Anesthesiology

## 2016-04-20 ENCOUNTER — Encounter (HOSPITAL_COMMUNITY): Payer: Self-pay | Admitting: *Deleted

## 2016-04-20 ENCOUNTER — Telehealth: Payer: Self-pay

## 2016-04-20 DIAGNOSIS — Z87891 Personal history of nicotine dependence: Secondary | ICD-10-CM

## 2016-04-20 DIAGNOSIS — M179 Osteoarthritis of knee, unspecified: Secondary | ICD-10-CM | POA: Diagnosis present

## 2016-04-20 DIAGNOSIS — M1711 Unilateral primary osteoarthritis, right knee: Secondary | ICD-10-CM | POA: Diagnosis not present

## 2016-04-20 DIAGNOSIS — M25561 Pain in right knee: Secondary | ICD-10-CM | POA: Diagnosis not present

## 2016-04-20 DIAGNOSIS — Z6839 Body mass index (BMI) 39.0-39.9, adult: Secondary | ICD-10-CM

## 2016-04-20 DIAGNOSIS — K219 Gastro-esophageal reflux disease without esophagitis: Secondary | ICD-10-CM | POA: Diagnosis present

## 2016-04-20 DIAGNOSIS — Z79899 Other long term (current) drug therapy: Secondary | ICD-10-CM

## 2016-04-20 DIAGNOSIS — Z96652 Presence of left artificial knee joint: Secondary | ICD-10-CM | POA: Diagnosis not present

## 2016-04-20 DIAGNOSIS — Z8521 Personal history of malignant neoplasm of larynx: Secondary | ICD-10-CM

## 2016-04-20 DIAGNOSIS — I8390 Asymptomatic varicose veins of unspecified lower extremity: Secondary | ICD-10-CM | POA: Diagnosis not present

## 2016-04-20 DIAGNOSIS — E119 Type 2 diabetes mellitus without complications: Secondary | ICD-10-CM | POA: Diagnosis not present

## 2016-04-20 DIAGNOSIS — M1710 Unilateral primary osteoarthritis, unspecified knee: Secondary | ICD-10-CM

## 2016-04-20 DIAGNOSIS — E039 Hypothyroidism, unspecified: Secondary | ICD-10-CM | POA: Diagnosis not present

## 2016-04-20 HISTORY — PX: TOTAL KNEE ARTHROPLASTY: SHX125

## 2016-04-20 LAB — TYPE AND SCREEN
ABO/RH(D): O POS
Antibody Screen: NEGATIVE

## 2016-04-20 SURGERY — ARTHROPLASTY, KNEE, TOTAL
Anesthesia: Spinal | Site: Knee | Laterality: Right

## 2016-04-20 MED ORDER — MENTHOL 3 MG MT LOZG: 1.0000 | LOZENGE | OROMUCOSAL | Status: DC | PRN

## 2016-04-20 MED ORDER — DOCUSATE SODIUM 100 MG PO CAPS
100.0000 mg | ORAL_CAPSULE | Freq: Two times a day (BID) | ORAL | Status: DC
  Administered 2016-04-21 – 2016-04-22 (×3): 100 mg via ORAL
  Filled 2016-04-20 (×3): qty 1

## 2016-04-20 MED ORDER — PROMETHAZINE HCL 25 MG/ML IJ SOLN: 6.2500 mg | INTRAMUSCULAR | Status: DC | PRN

## 2016-04-20 MED ORDER — MORPHINE SULFATE (PF) 2 MG/ML IV SOLN
1.0000 mg | INTRAVENOUS | Status: DC | PRN
  Administered 2016-04-20: 1 mg via INTRAVENOUS
  Filled 2016-04-20: qty 1

## 2016-04-20 MED ORDER — SODIUM CHLORIDE 0.9 % IJ SOLN
INTRAMUSCULAR | Status: AC
  Filled 2016-04-20: qty 50

## 2016-04-20 MED ORDER — SODIUM CHLORIDE 0.9 % IV SOLN
INTRAVENOUS | Status: DC
  Administered 2016-04-20 – 2016-04-21 (×2): via INTRAVENOUS

## 2016-04-20 MED ORDER — METHOCARBAMOL 500 MG PO TABS
500.0000 mg | ORAL_TABLET | Freq: Four times a day (QID) | ORAL | Status: DC | PRN
  Administered 2016-04-20: 500 mg via ORAL
  Filled 2016-04-20: qty 1

## 2016-04-20 MED ORDER — BISACODYL 10 MG RE SUPP: 10.0000 mg | Freq: Every day | RECTAL | Status: DC | PRN

## 2016-04-20 MED ORDER — PROPOFOL 500 MG/50ML IV EMUL
INTRAVENOUS | Status: DC | PRN
  Administered 2016-04-20: 75 ug/kg/min via INTRAVENOUS

## 2016-04-20 MED ORDER — CEFAZOLIN SODIUM-DEXTROSE 2-4 GM/100ML-% IV SOLN
INTRAVENOUS | Status: AC
  Filled 2016-04-20: qty 100

## 2016-04-20 MED ORDER — MIDAZOLAM HCL 5 MG/5ML IJ SOLN
INTRAMUSCULAR | Status: DC | PRN
  Administered 2016-04-20: 2 mg via INTRAVENOUS

## 2016-04-20 MED ORDER — MIDAZOLAM HCL 2 MG/2ML IJ SOLN
INTRAMUSCULAR | Status: AC
  Filled 2016-04-20: qty 2

## 2016-04-20 MED ORDER — METOCLOPRAMIDE HCL 5 MG PO TABS: 5.0000 mg | ORAL_TABLET | Freq: Three times a day (TID) | ORAL | Status: DC | PRN

## 2016-04-20 MED ORDER — BUPIVACAINE LIPOSOME 1.3 % IJ SUSP
INTRAMUSCULAR | Status: DC | PRN
  Administered 2016-04-20: 20 mL

## 2016-04-20 MED ORDER — PHENYLEPHRINE 40 MCG/ML (10ML) SYRINGE FOR IV PUSH (FOR BLOOD PRESSURE SUPPORT)
PREFILLED_SYRINGE | INTRAVENOUS | Status: AC
  Filled 2016-04-20: qty 10

## 2016-04-20 MED ORDER — FENTANYL CITRATE (PF) 100 MCG/2ML IJ SOLN
INTRAMUSCULAR | Status: AC
  Filled 2016-04-20: qty 2

## 2016-04-20 MED ORDER — SODIUM CHLORIDE 0.9 % IV SOLN
INTRAVENOUS | Status: DC | PRN
  Administered 2016-04-20: 2000 mg via TOPICAL

## 2016-04-20 MED ORDER — FLEET ENEMA 7-19 GM/118ML RE ENEM: 1.0000 | ENEMA | Freq: Once | RECTAL | Status: DC | PRN

## 2016-04-20 MED ORDER — ACETAMINOPHEN 10 MG/ML IV SOLN
INTRAVENOUS | Status: AC
  Filled 2016-04-20: qty 100

## 2016-04-20 MED ORDER — ONDANSETRON HCL 4 MG/2ML IJ SOLN: 4.0000 mg | Freq: Four times a day (QID) | INTRAMUSCULAR | Status: DC | PRN

## 2016-04-20 MED ORDER — METOCLOPRAMIDE HCL 5 MG/ML IJ SOLN: 5.0000 mg | Freq: Three times a day (TID) | INTRAMUSCULAR | Status: DC | PRN

## 2016-04-20 MED ORDER — SODIUM CHLORIDE 0.9 % IR SOLN
Status: DC | PRN
  Administered 2016-04-20: 1000 mL

## 2016-04-20 MED ORDER — PANTOPRAZOLE SODIUM 40 MG PO TBEC
80.0000 mg | DELAYED_RELEASE_TABLET | Freq: Every day | ORAL | Status: DC
  Administered 2016-04-21: 08:00:00 80 mg via ORAL
  Filled 2016-04-20: qty 2

## 2016-04-20 MED ORDER — METHOCARBAMOL 1000 MG/10ML IJ SOLN
500.0000 mg | Freq: Four times a day (QID) | INTRAVENOUS | Status: DC | PRN
  Administered 2016-04-20: 500 mg via INTRAVENOUS
  Filled 2016-04-20: qty 5
  Filled 2016-04-20: qty 550

## 2016-04-20 MED ORDER — CEFAZOLIN SODIUM-DEXTROSE 2-4 GM/100ML-% IV SOLN
2.0000 g | Freq: Four times a day (QID) | INTRAVENOUS | Status: AC
  Administered 2016-04-20 (×2): 2 g via INTRAVENOUS
  Filled 2016-04-20 (×2): qty 100

## 2016-04-20 MED ORDER — ONDANSETRON HCL 4 MG/2ML IJ SOLN
INTRAMUSCULAR | Status: DC | PRN
  Administered 2016-04-20: 4 mg via INTRAVENOUS

## 2016-04-20 MED ORDER — RIVAROXABAN 10 MG PO TABS
10.0000 mg | ORAL_TABLET | Freq: Every day | ORAL | Status: DC
  Administered 2016-04-21 – 2016-04-22 (×2): 10 mg via ORAL
  Filled 2016-04-20 (×2): qty 1

## 2016-04-20 MED ORDER — ACETAMINOPHEN 500 MG PO TABS
1000.0000 mg | ORAL_TABLET | Freq: Four times a day (QID) | ORAL | Status: AC
  Administered 2016-04-20 – 2016-04-21 (×4): 1000 mg via ORAL
  Filled 2016-04-20 (×4): qty 2

## 2016-04-20 MED ORDER — LACTATED RINGERS IV SOLN
INTRAVENOUS | Status: DC
  Administered 2016-04-20 (×2): via INTRAVENOUS

## 2016-04-20 MED ORDER — DEXAMETHASONE SODIUM PHOSPHATE 10 MG/ML IJ SOLN
10.0000 mg | Freq: Once | INTRAMUSCULAR | Status: AC
  Administered 2016-04-20: 10 mg via INTRAVENOUS

## 2016-04-20 MED ORDER — DIPHENHYDRAMINE HCL 12.5 MG/5ML PO ELIX: 12.5000 mg | ORAL_SOLUTION | ORAL | Status: DC | PRN

## 2016-04-20 MED ORDER — CEFAZOLIN SODIUM-DEXTROSE 2-4 GM/100ML-% IV SOLN
2.0000 g | INTRAVENOUS | Status: AC
  Administered 2016-04-20: 2 g via INTRAVENOUS
  Filled 2016-04-20: qty 100

## 2016-04-20 MED ORDER — ACETAMINOPHEN 10 MG/ML IV SOLN
1000.0000 mg | Freq: Once | INTRAVENOUS | Status: AC
  Administered 2016-04-20: 1000 mg via INTRAVENOUS
  Filled 2016-04-20: qty 100

## 2016-04-20 MED ORDER — BUPIVACAINE IN DEXTROSE 0.75-8.25 % IT SOLN
INTRATHECAL | Status: DC | PRN
  Administered 2016-04-20: 2 mL via INTRATHECAL

## 2016-04-20 MED ORDER — ACETAMINOPHEN 650 MG RE SUPP: 650.0000 mg | Freq: Four times a day (QID) | RECTAL | Status: DC | PRN

## 2016-04-20 MED ORDER — HYDROMORPHONE HCL 1 MG/ML IJ SOLN: 0.2500 mg | INTRAMUSCULAR | Status: DC | PRN

## 2016-04-20 MED ORDER — PROMETHAZINE HCL 25 MG/ML IJ SOLN: 6.2500 mg | Freq: Four times a day (QID) | INTRAMUSCULAR | Status: DC | PRN

## 2016-04-20 MED ORDER — PROPOFOL 10 MG/ML IV BOLUS
INTRAVENOUS | Status: AC
  Filled 2016-04-20: qty 80

## 2016-04-20 MED ORDER — DEXAMETHASONE SODIUM PHOSPHATE 10 MG/ML IJ SOLN
10.0000 mg | Freq: Once | INTRAMUSCULAR | Status: AC
  Administered 2016-04-21: 10 mg via INTRAVENOUS
  Filled 2016-04-20: qty 1

## 2016-04-20 MED ORDER — PHENYLEPHRINE 40 MCG/ML (10ML) SYRINGE FOR IV PUSH (FOR BLOOD PRESSURE SUPPORT)
PREFILLED_SYRINGE | INTRAVENOUS | Status: DC | PRN
  Administered 2016-04-20 (×3): 80 ug via INTRAVENOUS

## 2016-04-20 MED ORDER — STERILE WATER FOR IRRIGATION IR SOLN
Status: DC | PRN
  Administered 2016-04-20: 3000 mL

## 2016-04-20 MED ORDER — ONDANSETRON HCL 4 MG/2ML IJ SOLN
INTRAMUSCULAR | Status: AC
  Filled 2016-04-20: qty 2

## 2016-04-20 MED ORDER — DEXAMETHASONE SODIUM PHOSPHATE 10 MG/ML IJ SOLN
INTRAMUSCULAR | Status: AC
  Filled 2016-04-20: qty 1

## 2016-04-20 MED ORDER — HYDROMORPHONE HCL 2 MG PO TABS
2.0000 mg | ORAL_TABLET | ORAL | Status: DC | PRN
  Administered 2016-04-20 – 2016-04-21 (×5): 2 mg via ORAL
  Filled 2016-04-20 (×6): qty 1

## 2016-04-20 MED ORDER — TRANEXAMIC ACID 1000 MG/10ML IV SOLN
2000.0000 mg | Freq: Once | INTRAVENOUS | Status: DC
  Filled 2016-04-20: qty 20

## 2016-04-20 MED ORDER — ACETAMINOPHEN 325 MG PO TABS: 650.0000 mg | ORAL_TABLET | Freq: Four times a day (QID) | ORAL | Status: DC | PRN

## 2016-04-20 MED ORDER — POLYETHYLENE GLYCOL 3350 17 G PO PACK: 17.0000 g | PACK | Freq: Every day | ORAL | Status: DC | PRN

## 2016-04-20 MED ORDER — ONDANSETRON HCL 4 MG PO TABS
4.0000 mg | ORAL_TABLET | Freq: Four times a day (QID) | ORAL | Status: DC | PRN
  Administered 2016-04-20 – 2016-04-21 (×2): 4 mg via ORAL
  Filled 2016-04-20 (×2): qty 1

## 2016-04-20 MED ORDER — BUPIVACAINE LIPOSOME 1.3 % IJ SUSP
20.0000 mL | Freq: Once | INTRAMUSCULAR | Status: DC
  Filled 2016-04-20: qty 20

## 2016-04-20 MED ORDER — BUPIVACAINE HCL 0.25 % IJ SOLN
INTRAMUSCULAR | Status: DC | PRN
  Administered 2016-04-20: 30 mL

## 2016-04-20 MED ORDER — FENTANYL CITRATE (PF) 100 MCG/2ML IJ SOLN
INTRAMUSCULAR | Status: DC | PRN
  Administered 2016-04-20: 50 ug via INTRAVENOUS
  Administered 2016-04-20 (×2): 25 ug via INTRAVENOUS

## 2016-04-20 MED ORDER — PHENOL 1.4 % MT LIQD
1.0000 | OROMUCOSAL | Status: DC | PRN
  Filled 2016-04-20: qty 177

## 2016-04-20 MED ORDER — OXYCODONE HCL 5 MG PO TABS
5.0000 mg | ORAL_TABLET | ORAL | Status: DC | PRN
  Administered 2016-04-20: 5 mg via ORAL
  Filled 2016-04-20: qty 1

## 2016-04-20 MED ORDER — LIDOCAINE 2% (20 MG/ML) 5 ML SYRINGE
INTRAMUSCULAR | Status: DC | PRN
  Administered 2016-04-20: 50 mg via INTRAVENOUS

## 2016-04-20 MED ORDER — SODIUM CHLORIDE 0.9 % IJ SOLN
INTRAMUSCULAR | Status: DC | PRN
  Administered 2016-04-20: 30 mL

## 2016-04-20 MED ORDER — 0.9 % SODIUM CHLORIDE (POUR BTL) OPTIME
TOPICAL | Status: DC | PRN
  Administered 2016-04-20: 1000 mL

## 2016-04-20 MED ORDER — BUPIVACAINE HCL (PF) 0.25 % IJ SOLN
INTRAMUSCULAR | Status: AC
  Filled 2016-04-20: qty 30

## 2016-04-20 MED ORDER — TRAMADOL HCL 50 MG PO TABS: 50.0000 mg | ORAL_TABLET | Freq: Four times a day (QID) | ORAL | Status: DC | PRN

## 2016-04-20 SURGICAL SUPPLY — 49 items
BAG DECANTER FOR FLEXI CONT (MISCELLANEOUS) ×2 IMPLANT
BAG ZIPLOCK 12X15 (MISCELLANEOUS) ×2 IMPLANT
BANDAGE ACE 6X5 VEL STRL LF (GAUZE/BANDAGES/DRESSINGS) ×2 IMPLANT
BLADE SAG 18X100X1.27 (BLADE) ×2 IMPLANT
BLADE SAW SGTL 11.0X1.19X90.0M (BLADE) ×2 IMPLANT
BOWL SMART MIX CTS (DISPOSABLE) ×2 IMPLANT
CAPT KNEE TOTAL 3 ATTUNE ×2 IMPLANT
CEMENT HV SMART SET (Cement) ×4 IMPLANT
CLOTH BEACON ORANGE TIMEOUT ST (SAFETY) ×2 IMPLANT
CUFF TOURN SGL QUICK 34 (TOURNIQUET CUFF) ×1
CUFF TRNQT CYL 34X4X40X1 (TOURNIQUET CUFF) ×1 IMPLANT
DECANTER SPIKE VIAL GLASS SM (MISCELLANEOUS) ×2 IMPLANT
DRAPE U-SHAPE 47X51 STRL (DRAPES) ×2 IMPLANT
DRSG ADAPTIC 3X8 NADH LF (GAUZE/BANDAGES/DRESSINGS) ×2 IMPLANT
DRSG PAD ABDOMINAL 8X10 ST (GAUZE/BANDAGES/DRESSINGS) ×2 IMPLANT
DURAPREP 26ML APPLICATOR (WOUND CARE) ×2 IMPLANT
ELECT REM PT RETURN 9FT ADLT (ELECTROSURGICAL) ×2
ELECTRODE REM PT RTRN 9FT ADLT (ELECTROSURGICAL) ×1 IMPLANT
EVACUATOR 1/8 PVC DRAIN (DRAIN) ×2 IMPLANT
GAUZE SPONGE 4X4 12PLY STRL (GAUZE/BANDAGES/DRESSINGS) ×2 IMPLANT
GLOVE BIO SURGEON STRL SZ7.5 (GLOVE) IMPLANT
GLOVE BIO SURGEON STRL SZ8 (GLOVE) ×2 IMPLANT
GLOVE BIOGEL PI IND STRL 6.5 (GLOVE) ×6 IMPLANT
GLOVE BIOGEL PI IND STRL 8 (GLOVE) ×1 IMPLANT
GLOVE BIOGEL PI INDICATOR 6.5 (GLOVE) ×6
GLOVE BIOGEL PI INDICATOR 8 (GLOVE) ×1
GLOVE SURG SS PI 6.5 STRL IVOR (GLOVE) IMPLANT
GOWN STRL REUS W/TWL LRG LVL3 (GOWN DISPOSABLE) ×8 IMPLANT
GOWN STRL REUS W/TWL XL LVL3 (GOWN DISPOSABLE) IMPLANT
HANDPIECE INTERPULSE COAX TIP (DISPOSABLE) ×1
IMMOBILIZER KNEE 20 (SOFTGOODS) ×2
IMMOBILIZER KNEE 20 THIGH 36 (SOFTGOODS) ×1 IMPLANT
MANIFOLD NEPTUNE II (INSTRUMENTS) ×2 IMPLANT
NS IRRIG 1000ML POUR BTL (IV SOLUTION) ×2 IMPLANT
PACK TOTAL KNEE CUSTOM (KITS) ×2 IMPLANT
PADDING CAST COTTON 6X4 STRL (CAST SUPPLIES) ×2 IMPLANT
POSITIONER SURGICAL ARM (MISCELLANEOUS) ×2 IMPLANT
SET HNDPC FAN SPRY TIP SCT (DISPOSABLE) ×1 IMPLANT
STRIP CLOSURE SKIN 1/2X4 (GAUZE/BANDAGES/DRESSINGS) ×2 IMPLANT
SUT MNCRL AB 4-0 PS2 18 (SUTURE) ×2 IMPLANT
SUT PDS AB 1 CT1 27 (SUTURE) ×2 IMPLANT
SUT VIC AB 2-0 CT1 27 (SUTURE) ×4
SUT VIC AB 2-0 CT1 TAPERPNT 27 (SUTURE) ×4 IMPLANT
SUT VLOC 180 0 24IN GS25 (SUTURE) ×2 IMPLANT
SYR 50ML LL SCALE MARK (SYRINGE) ×2 IMPLANT
TRAY FOLEY W/METER SILVER 16FR (SET/KITS/TRAYS/PACK) ×2 IMPLANT
WATER STERILE IRR 1500ML POUR (IV SOLUTION) ×2 IMPLANT
WRAP KNEE MAXI GEL POST OP (GAUZE/BANDAGES/DRESSINGS) ×2 IMPLANT
YANKAUER SUCT BULB TIP 10FT TU (MISCELLANEOUS) ×2 IMPLANT

## 2016-04-20 NOTE — Telephone Encounter (Signed)
lmtcb Emily Drozdowski, CMA  

## 2016-04-20 NOTE — Anesthesia Procedure Notes (Addendum)
Spinal  Start time: 04/20/2016 7:00 AM End time: 04/20/2016 7:10 AM Staffing Anesthesiologist: Suzette Battiest Performed: anesthesiologist  Preanesthetic Checklist Completed: patient identified, site marked, surgical consent, pre-op evaluation, timeout performed, IV checked, risks and benefits discussed and monitors and equipment checked Spinal Block Patient position: sitting Prep: DuraPrep Patient monitoring: cardiac monitor and continuous pulse ox Approach: midline Location: L4-5 Injection technique: single-shot Needle Needle type: Sprotte  Needle gauge: 24 G Needle length: 12.7 cm Needle insertion depth: 8 cm Assessment Sensory level: T6 Additional Notes Lot and exp date ok.  Pt tol well.  -heme/para.

## 2016-04-20 NOTE — Anesthesia Postprocedure Evaluation (Signed)
Anesthesia Post Note  Patient: Amanda Carpenter  Procedure(s) Performed: Procedure(s) (LRB): RIGHT TOTAL KNEE ARTHROPLASTY (Right)  Patient location during evaluation: PACU Anesthesia Type: Spinal and MAC Level of consciousness: awake and alert Pain management: pain level controlled Vital Signs Assessment: post-procedure vital signs reviewed and stable Respiratory status: spontaneous breathing and respiratory function stable Cardiovascular status: blood pressure returned to baseline and stable Postop Assessment: spinal receding Anesthetic complications: no    Last Vitals:  Vitals:   04/20/16 1155 04/20/16 1258  BP: (!) 146/76 (!) 157/64  Pulse: 74 65  Resp: 12 14  Temp: 36.4 C 36.3 C    Last Pain:  Vitals:   04/20/16 1258  TempSrc: Axillary  PainSc:                  Tiajuana Amass

## 2016-04-20 NOTE — Telephone Encounter (Signed)
-----   Message from [name], [place] sent at 04/20/2016  3:52 PM EST ----- Normal mammogram. Repeat screening in one year.

## 2016-04-20 NOTE — H&P (View-Only) (Signed)
Amanda Carpenter DOB: 03-29-1946 Married / Language: English / Race: White Female Date of Admission:  04/20/2016 CC:  Right Knee Pain History of Present Illness The patient is a 70 year old female who comes in  for a preoperative History and Physical. The patient is scheduled for a right total knee arthroplasty to be performed by Dr. Dione Plover. Aluisio, MD at [facility] on 04-20-2016. The patient is a 70 year old female who presented with knee complaints. The patient reports right knee symptoms including: pain (weightbearing) which began year(s) ago without any known injury. The patient describes their pain as aching.The patient feels that the symptoms are worsening. The patient has the current diagnosis of knee osteoarthritis. Past treatment for this problem has included intra-articular injection of corticosteroids (right knee Flexogenix Jan.2017.). Symptoms are reported to be located in the left knee and left medial knee and include knee pain. Current treatment includes nonsteroidal anti-inflammatory drugs (Tramadol). Amanda Carpenter was seen as a new patient by Dr. Wynelle Link for surgical evaluation of the right knee. She has been told that she has bone-on-bone arthritis in the outside of the right knee which would need surgrey. She is S/P Left Total Knee Arthroplasty from December 2015 which was performed at [facility] by Dr. Nilsa Nutting. She did very well following her surgery and now the left leg is her good strong leg. The right knee got worse and she was going to get it operated on by Dr. Claiborne Billings however, due to an insurance change, she was unable to go back to [facility] and was referred over to Dr. Wynelle Link. She lives in [place] and has work in the [employer] most of her life. Her right knee is tolerable as far as pain which is mainly activity related but it has interferred with her work. She denies any locking, popping, or buckling with the knee. She does get sedintary stiffness  and occasionally pain at night. She does have some discomfort with steps but able to get in and out of her Amanda Carpenter Lei without difficulty. Injections have helped in the past and her most recent cortisone which was done in January of this year is still helping at this time. She has not tired any POTC meds except for Ibuprofen whihc caused a collagenous colitis. She is ready to get the knee fixed and is ready to proceed with surgery on the right knee at this time. The right knee has gotten progressively worse over time with regards to function. She said the pain occurs with activity, not at rest, although the pain is troublesome, the function is what is giving her the most problem. She has had the left knee replaced and did great with that. Insurance is changed, thus she could not go back to [place]. She is ready to proceed with surgery.   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 Heart Disease  Father, Paternal Grandfather.  Social History Children  2 Current drinker  08/09/2015: Currently drinks wine only occasionally per week Current work status  working full time Exercise  Exercises daily; does other Living situation  live with spouse Marital status  married No history of drug/alcohol rehab  Not under pain contract  Number of flights of stairs before winded  1 Tobacco /  smoke exposure  08/09/2015: no Tobacco use  Former smoker. 08/09/2015: smoke(d) 1 pack(s) per day  Medication History  Omeprazole (20MG  Capsule DR, Oral) Active. Vitamin D (Oral) Specific strength unknown - Active.   Past Surgical History  Gallbladder Surgery  laporoscopic Total Knee Replacement  Date: 2015. left   Review of  Systems General Not Present- Chills, Fatigue, Fever, Memory Loss, Night Sweats, Weight Gain and Weight Loss. Skin Not Present- Eczema, Hives, Itching, Lesions and Rash. HEENT Present- Tinnitus. Not Present- Dentures, Double Vision, Headache, Hearing Loss and Visual Loss. Respiratory Not Present- Allergies, Chronic Cough, Coughing up blood, Shortness of breath at rest and Shortness of breath with exertion. Cardiovascular Present- Swelling. Not Present- Chest Pain, Difficulty Breathing Lying Down, Murmur, Palpitations and Racing/skipping heartbeats. Gastrointestinal Not Present- Abdominal Pain, Bloody Stool, Constipation, Diarrhea, Difficulty Swallowing, Heartburn, Jaundice, Loss of appetitie, Nausea and Vomiting. Female Genitourinary Not Present- Blood in Urine, Discharge, Flank Pain, Incontinence, Painful Urination, Urgency, Urinary frequency, Urinary Retention, Urinating at Night and Weak urinary stream. Musculoskeletal Present- Joint Pain and Joint Swelling. Not Present- Back Pain, Morning Stiffness, Muscle Pain, Muscle Weakness and Spasms. Neurological Not Present- Blackout spells, Difficulty with balance, Dizziness, Paralysis, Tremor and Weakness. Psychiatric Not Present- Insomnia.  Vitals Weight: 220 lb Height: 64in Weight was reported by patient. Height was reported by patient. Body Surface Area: 2.04 m Body Mass Index: 37.76 kg/m  Pulse: 64 (Regular)  BP: 138/72 (Sitting, Left Arm, Standard)   Physical Exam  General Mental Status -Alert, cooperative and good historian. General Appearance-pleasant, Not in acute distress. Orientation-Oriented X3. Build & Nutrition-Well nourished and Well developed.  Head and Neck Head-normocephalic, atraumatic . Neck Global Assessment - supple, no bruit auscultated on the right, no bruit auscultated on the left.  Eye Vision-Wears corrective lenses(readers). Pupil - Bilateral-Regular and Round. Motion -  Bilateral-EOMI.  Chest and Lung Exam Auscultation Breath sounds - clear at anterior chest wall and clear at posterior chest wall. Adventitious sounds - No Adventitious sounds.  Cardiovascular Auscultation Rhythm - Regular rate and rhythm. Heart Sounds - S1 WNL and S2 WNL. Murmurs & Other Heart Sounds - Auscultation of the heart reveals - No Murmurs.  Abdomen Inspection Contour - Generalized mild distention. Palpation/Percussion Tenderness - Abdomen is non-tender to palpation. Rigidity (guarding) - Abdomen is soft. Auscultation Auscultation of the abdomen reveals - Bowel sounds normal.  Female Genitourinary Note: Not done, not pertinent to present illness   Musculoskeletal Note: She is alert and oriented, no apparent distress. Her hips show normal range of motion with no discomfort. Her left knee shows excellent alignment. Range of motion about 0 to 120. There is no tenderness or instability about the left knee. Her right knee shows significant valgus deformity. Range of motion in the right knee is about 5 to 130. There is marked crepitus on range of motion of the right knee, some tenderness lateral greater than medial with no instability. Pulse, sensation and motor intact. She has significantly antalgic gait with a valgus thrust on the right.  RADIOGRAPHS AP both knees, lateral of the right shows prosthesis on the left in good position, no abnormalities. On the right, she has got bone on bone arthritis lateral to about a 10-degree valgus deformity. She also has patellofemoral bone on bone.  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 Rosanna Randy  Topical TXA  Anesthesia Issues: None  Signed electronically by Ok Edwards, III PA-

## 2016-04-20 NOTE — Interval H&P Note (Signed)
History and Physical Interval Note:  04/20/2016 6:50 AM  Amanda Carpenter  has presented today for surgery, with the diagnosis of RIGHT KNEE OA  The various methods of treatment have been discussed with the patient and family. After consideration of risks, benefits and other options for treatment, the patient has consented to  Procedure(s): RIGHT TOTAL KNEE ARTHROPLASTY (Right) as a surgical intervention .  The patient's history has been reviewed, patient examined, no change in status, stable for surgery.  I have reviewed the patient's chart and labs.  Questions were answered to the patient's satisfaction.     Gearlean Alf

## 2016-04-20 NOTE — Transfer of Care (Signed)
Immediate Anesthesia Transfer of Care Note  Patient: Amanda Carpenter  Procedure(s) Performed: Procedure(s): RIGHT TOTAL KNEE ARTHROPLASTY (Right)  Patient Location: PACU  Anesthesia Type:Spinal  Level of Consciousness:  sedated, patient cooperative and responds to stimulation  Airway & Oxygen Therapy:Patient Spontanous Breathing and Patient connected to face mask oxgen  Post-op Assessment:  Report given to PACU RN and Post -op Vital signs reviewed and stable  Post vital signs:  Reviewed and stable  Last Vitals:  Vitals:   04/20/16 0553  BP: (!) 141/53  Pulse: 73  Resp: 18  Temp: [id number] C    Complications: No apparent anesthesia complications

## 2016-04-20 NOTE — Evaluation (Signed)
Physical Therapy Evaluation Patient Details Name: Amanda Carpenter MRN: [id number] DOB: 1945-07-10 Today's Date: 04/20/2016   History of Present Illness  R TKA, h/o L TKA 2015  Clinical Impression  Pt is s/p TKA resulting in the deficits listed below (see PT Problem List). Pt ambulated 25' with RW and performed TKA exercises with min A. Good progress expected.  Pt will benefit from skilled PT to increase their independence and safety with mobility to allow discharge to the venue listed below.      Follow Up Recommendations Home health PT    Equipment Recommendations  None recommended by PT    Recommendations for Other Services OT consult     Precautions / Restrictions Precautions Precautions: Knee Precaution Comments: reviewed no pillow under knee Required Braces or Orthoses: Knee Immobilizer - Right (did not use, pt able to do SLR ) Restrictions Weight Bearing Restrictions: Yes RLE Weight Bearing: Weight bearing as tolerated      Mobility  Bed Mobility Overal bed mobility: Needs Assistance Bed Mobility: Supine to Sit     Supine to sit: Min guard     General bed mobility comments: instructed pt to self assist RLE with LLE, min guard for RLE  Transfers Overall transfer level: Needs assistance Equipment used: Rolling walker (2 wheeled) Transfers: Sit to/from Stand Sit to Stand: Min assist         General transfer comment: VCs hand placement, min A to rise  Ambulation/Gait Ambulation/Gait assistance: Min guard Ambulation Distance (Feet): 25 Feet Assistive device: Rolling walker (2 wheeled) Gait Pattern/deviations: Step-to pattern;Decreased step length - right;Antalgic   Gait velocity interpretation: Below normal speed for age/gender General Gait Details: VCs sequencing and positioning in RW, steady, no LOB  Stairs            Wheelchair Mobility    Modified Rankin (Stroke Patients Only)       Balance Overall balance assessment: Modified  Independent                                           Pertinent Vitals/Pain Pain Assessment: 0-10 Pain Score: 6  Pain Location: R TKA Pain Descriptors / Indicators: Sore Pain Intervention(s): Limited activity within patient's tolerance;Monitored during session;Premedicated before session;Ice applied    Home Living Family/patient expects to be discharged to:: Private residence Living Arrangements: Parent Available Help at Discharge: Family;Available 24 hours/day Type of Home: House Home Access: Stairs to enter Entrance Stairs-Rails: Can reach both;Left;Right Entrance Stairs-Number of Steps: 3 Home Layout: One level Home Equipment: Crutches;Walker - 2 wheels;Grab bars - toilet;Grab bars - tub/shower;Shower seat - built in      Prior Function Level of Independence: Independent               Journalist, newspaper        Extremity/Trunk Assessment   Upper Extremity Assessment: Overall WFL for tasks assessed           Lower Extremity Assessment: RLE deficits/detail RLE Deficits / Details: 5-40* AAROM R knee, SLR 3/5       Communication      Cognition Arousal/Alertness: Awake/alert Behavior During Therapy: WFL for tasks assessed/performed Overall Cognitive Status: Within Functional Limits for tasks assessed                      General Comments      Exercises Total Joint Exercises Ankle  Circles/Pumps: AROM;Both;10 reps Quad Sets: AROM;Both;10 reps;Supine Heel Slides: AAROM;Right;10 reps;Supine Straight Leg Raises: AROM;Supine (x 1) Long Arc Quad: AROM;Right;5 reps;Seated Goniometric ROM: 5-40* AAROM R knee   Assessment/Plan    PT Assessment Patient needs continued PT services  PT Problem List Decreased strength;Decreased range of motion;Decreased activity tolerance;Decreased balance;Decreased mobility;Decreased knowledge of use of DME;Pain          PT Treatment Interventions Gait training;DME instruction;Functional mobility  training;Stair training;Therapeutic exercise;Therapeutic activities;Patient/family education    PT Goals (Current goals can be found in the Care Plan section)  Acute Rehab PT Goals 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 Goal Formulation: With patient/family Time For Goal Achievement: 04/27/16 Potential to Achieve Goals: Good    Frequency 7X/week   Barriers to discharge        Co-evaluation               End of Session Equipment Utilized During Treatment: Gait belt Activity Tolerance: Patient tolerated treatment well Patient left: in chair;with call bell/phone within reach;with family/visitor present Nurse Communication: Mobility status         Time: [id number] PT Time Calculation (min) (ACUTE ONLY): 20 min   Charges:   PT Evaluation $PT Eval Low Complexity: 1 Procedure     PT G CodesPhilomena Doheny 04/20/2016, 2:49 PM [phone]

## 2016-04-20 NOTE — Op Note (Signed)
OPERATIVE REPORT-TOTAL KNEE ARTHROPLASTY   Pre-operative diagnosis- Osteoarthritis  Right knee(s)  Post-operative diagnosis- Osteoarthritis Right knee(s)  Procedure-  Right  Total Knee Arthroplasty  Surgeon- Amanda Plover. Cal Gindlesperger, MD  Assistant- Ardeen Jourdain, PA-C   Anesthesia-  Spinal  EBL-* No blood loss amount entered *   Drains Hemovac  Tourniquet time-  Total Tourniquet Time Documented: Thigh (Right) - 37 minutes Total: Thigh (Right) - 37 minutes     Complications- None  Condition-PACU - hemodynamically stable.   Brief Clinical Note  Amanda Carpenter is a 70 y.o. year old female with end stage OA of her right knee with progressively worsening pain and dysfunction. She has constant pain, with activity and at rest and significant functional deficits with difficulties even with ADLs. She has had extensive non-op management including analgesics, injections of cortisone and viscosupplements, and home exercise program, but remains in significant pain with significant dysfunction.Radiographs show bone on bone arthritis lateral and patellofemoral. She presents now for right Total Knee Arthroplasty.    Procedure in detail---   The patient is brought into the operating room and positioned supine on the operating table. After successful administration of  Spinal,   a tourniquet is placed high on the  Right thigh(s) and the lower extremity is prepped and draped in the usual sterile fashion. Time out is performed by the operating team and then the  Right lower extremity is wrapped in Esmarch, knee flexed and the tourniquet inflated to 300 mmHg.       A midline incision is made with a ten blade through the subcutaneous tissue to the level of the extensor mechanism. A fresh blade is used to make a medial parapatellar arthrotomy. Soft tissue over the proximal medial tibia is subperiosteally elevated to the joint line with a knife and into the semimembranosus bursa with a Cobb elevator. Soft  tissue over the proximal lateral tibia is elevated with attention being paid to avoiding the patellar tendon on the tibial tubercle. The patella is everted, knee flexed 90 degrees and the ACL and PCL are removed. Findings are bone on bone lateral and patellofemoral with large global osteophytes.        The drill is used to create a starting hole in the distal femur and the canal is thoroughly irrigated with sterile saline to remove the fatty contents. The 5 degree Right  valgus alignment guide is placed into the femoral canal and the distal femoral cutting block is pinned to remove 9 mm off the distal femur. Resection is made with an oscillating saw.      The tibia is subluxed forward and the menisci are removed. The extramedullary alignment guide is placed referencing proximally at the medial aspect of the tibial tubercle and distally along the second metatarsal axis and tibial crest. The block is pinned to remove 35mm off the more deficient lateral  side. Resection is made with an oscillating saw. Size 5is the most appropriate size for the tibia and the proximal tibia is prepared with the modular drill and keel punch for that size.      The femoral sizing guide is placed and size 5 is most appropriate. Rotation is marked off the epicondylar axis and confirmed by creating a rectangular flexion gap at 90 degrees. The size 5 cutting block is pinned in this rotation and the anterior, posterior and chamfer cuts are made with the oscillating saw. The intercondylar block is then placed and that cut is made.      Trial  size 5 tibial component, trial size 5 posterior stabilized femur and a 16  mm posterior stabilized rotating platform insert trial is placed. Full extension is achieved with excellent varus/valgus and anterior/posterior balance throughout full range of motion. The patella is everted and thickness measured to be 22  mm. Free hand resection is taken to 12 mm, a 35 template is placed, lug holes are drilled,  trial patella is placed, and it tracks normally. Osteophytes are removed off the posterior femur with the trial in place. All trials are removed and the cut bone surfaces prepared with pulsatile lavage. Cement is mixed and once ready for implantation, the size 5 tibial implant, size  5 posterior stabilized femoral component, and the size 35 patella are cemented in place and the patella is held with the clamp. The trial insert is placed and the knee held in full extension. The Exparel (20 ml mixed with 30 ml saline) and .25% Bupivicaine, are injected into the extensor mechanism, posterior capsule, medial and lateral gutters and subcutaneous tissues.  All extruded cement is removed and once the cement is hard the permanent 16 mm posterior stabilized rotating platform insert is placed into the tibial tray.      The wound is copiously irrigated with saline solution and the extensor mechanism closed over a hemovac drain with #1 V-loc suture. The tourniquet is released for a total tourniquet time of 37  minutes. Flexion against gravity is 130 degrees and the patella tracks normally. Subcutaneous tissue is closed with 2.0 vicryl and subcuticular with running 4.0 Monocryl. The incision is cleaned and dried and steri-strips and a bulky sterile dressing are applied. The limb is placed into a knee immobilizer and the patient is awakened and transported to recovery in stable condition.      Please note that a surgical assistant was a medical necessity for this procedure in order to perform it in a safe and expeditious manner. Surgical assistant was necessary to retract the ligaments and vital neurovascular structures to prevent injury to them and also necessary for proper positioning of the limb to allow for anatomic placement of the prosthesis.   Amanda Plover Lillianna Sabel, MD    04/20/2016, 8:17 AM

## 2016-04-21 LAB — CBC
HCT: 35.6 % — ABNORMAL LOW (ref 36.0–46.0)
Hemoglobin: 11.3 g/dL — ABNORMAL LOW (ref 12.0–15.0)
MCH: 27.3 pg (ref 26.0–34.0)
MCHC: 31.7 g/dL (ref 30.0–36.0)
MCV: 86 fL (ref 78.0–100.0)
PLATELETS: 285 10*3/uL (ref 150–400)
RBC: 4.14 MIL/uL (ref 3.87–5.11)
RDW: 14.3 % (ref 11.5–15.5)
WBC: 12.1 10*3/uL — AB (ref 4.0–10.5)

## 2016-04-21 LAB — BASIC METABOLIC PANEL
ANION GAP: 6 (ref 5–15)
BUN: 10 mg/dL (ref 6–20)
CALCIUM: 8.3 mg/dL — AB (ref 8.9–10.3)
CO2: 25 mmol/L (ref 22–32)
CREATININE: 0.76 mg/dL (ref 0.44–1.00)
Chloride: 107 mmol/L (ref 101–111)
Glucose, Bld: 138 mg/dL — ABNORMAL HIGH (ref 65–99)
Potassium: 4 mmol/L (ref 3.5–5.1)
SODIUM: 138 mmol/L (ref 135–145)

## 2016-04-21 MED ORDER — HYDROMORPHONE HCL 2 MG PO TABS: 2.0000 mg | ORAL_TABLET | ORAL | 0 refills | Status: DC | PRN

## 2016-04-21 MED ORDER — METHOCARBAMOL 500 MG PO TABS: 500.0000 mg | ORAL_TABLET | Freq: Four times a day (QID) | ORAL | 0 refills | Status: DC | PRN

## 2016-04-21 MED ORDER — NON FORMULARY: 20.0000 mg | Freq: Two times a day (BID) | Status: DC

## 2016-04-21 MED ORDER — OMEPRAZOLE 20 MG PO CPDR
20.0000 mg | DELAYED_RELEASE_CAPSULE | Freq: Two times a day (BID) | ORAL | Status: DC
  Administered 2016-04-21 – 2016-04-22 (×2): 20 mg via ORAL
  Filled 2016-04-21 (×2): qty 1

## 2016-04-21 MED ORDER — RIVAROXABAN 10 MG PO TABS: 10.0000 mg | ORAL_TABLET | Freq: Every day | ORAL | 0 refills | Status: DC

## 2016-04-21 MED ORDER — TRAMADOL HCL 50 MG PO TABS: 50.0000 mg | ORAL_TABLET | Freq: Four times a day (QID) | ORAL | 1 refills | Status: DC | PRN

## 2016-04-21 NOTE — Progress Notes (Signed)
Physical Therapy Treatment Patient Details Name: Amanda Carpenter MRN: [id number] DOB: [birth year] Today's Date: 04-30-2016    History of Present Illness R TKA, h/o L TKA 2015    PT Comments    Progressing well  Follow Up Recommendations  Home health PT     Equipment Recommendations  None recommended by PT    Recommendations for Other Services       Precautions / Restrictions Precautions Precautions: Knee Restrictions Weight Bearing Restrictions: No RLE Weight Bearing: Weight bearing as tolerated    Mobility  Bed Mobility Overal bed mobility: Needs Assistance Bed Mobility: Supine to Sit     Supine to sit: Supervision        Transfers Overall transfer level: Needs assistance Equipment used: Rolling walker (2 wheeled) Transfers: Sit to/from Stand Sit to Stand: Min guard         General transfer comment: VCs hand placement  Ambulation/Gait Ambulation/Gait assistance: Min guard Ambulation Distance (Feet): 55 Feet (10' more) Assistive device: Rolling walker (2 wheeled) Gait Pattern/deviations: Step-to pattern;Decreased step length - right;Antalgic   Gait velocity interpretation: Below normal speed for age/gender General Gait Details: VCs sequencing and positioning in RW   Stairs            Wheelchair Mobility    Modified Rankin (Stroke Patients Only)       Balance                                    Cognition Arousal/Alertness: Awake/alert Behavior During Therapy: WFL for tasks assessed/performed Overall Cognitive Status: Within Functional Limits for tasks assessed                      Exercises Total Joint Exercises Ankle Circles/Pumps: AROM;Both;10 reps Quad Sets: AROM;Both;10 reps Heel Slides: AAROM;Right;10 reps    General Comments        Pertinent Vitals/Pain Pain Assessment: 0-10 Pain Score: 4  Pain Location: right knee Pain Descriptors / Indicators: Sore Pain Intervention(s): Limited activity  within patient's tolerance;Premedicated before session;Ice applied;Repositioned    Home Living                      Prior Function            PT Goals (current goals can now be found in the care plan section) Acute Rehab PT Goals 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 Goal Formulation: With patient/family Time For Goal Achievement: 04/27/16 Potential to Achieve Goals: Good Progress towards PT goals: Progressing toward goals    Frequency    7X/week      PT Plan Current plan remains appropriate    Co-evaluation             End of Session Equipment Utilized During Treatment: Gait belt Activity Tolerance: Patient tolerated treatment well Patient left: in chair;with call bell/phone within reach;with chair alarm set     Time: [id number] PT Time Calculation (min) (ACUTE ONLY): 21 min  Charges:  $Gait Training: 8-22 mins                    G Codes:      Hershel Corkery 04-30-16, 10:59 AM

## 2016-04-21 NOTE — Discharge Summary (Signed)
Physician Discharge Summary   Patient ID: Amanda Carpenter MRN: [id number] DOB/AGE: [birth year] 70 y.o.  Admit date: 04/20/2016 Discharge date: 04/22/2016  Primary Diagnosis:  Osteoarthritis  Right knee(s) Admission Diagnoses:  Past Medical History:  Diagnosis Date  . Allergy   . Arthritis    OA  . 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 GERD (gastroesophageal reflux disease)   . Swelling of lower extremity    RARELY SWELLS   Discharge Diagnoses:   Principal Problem:   OA (osteoarthritis) of knee  Estimated body mass index is 39.54 kg/m as calculated from the following:   Height as of this encounter: '5\' 3"'  (1.6 m).   Weight as of this encounter: 101.2 kg (223 lb 3.2 oz).  Procedure:  Procedure(s) (LRB): RIGHT TOTAL KNEE ARTHROPLASTY (Right)   Consults: None  HPI: Amanda Carpenter is a 70 y.o. year old female with end stage OA of her right knee with progressively worsening pain and dysfunction. She has constant pain, with activity and at rest and significant functional deficits with difficulties even with ADLs. She has had extensive non-op management including analgesics, injections of cortisone and viscosupplements, and home exercise program, but remains in significant pain with significant dysfunction.Radiographs show bone on bone arthritis lateral and patellofemoral. She presents now for right Total Knee Arthroplasty.   Laboratory Data: Admission on 04/20/2016  Component Date Value Ref Range Status  . WBC 04/21/2016 12.1* 4.0 - 10.5 K/uL Final  . RBC 04/21/2016 4.14  3.87 - 5.11 MIL/uL Final  . Hemoglobin 04/21/2016 11.3* 12.0 - 15.0 g/dL Final  . HCT 04/21/2016 35.6* 36.0 - 46.0 % Final  . MCV 04/21/2016 86.0  78.0 - 100.0 fL Final  . MCH 04/21/2016 27.3  26.0 - 34.0 pg Final  . MCHC 04/21/2016 31.7  30.0 - 36.0 g/dL Final  . RDW 04/21/2016 14.3  11.5 - 15.5 % Final  . Platelets 04/21/2016 285  150 - 400 K/uL Final  . Sodium 04/21/2016 138  135 - 145 mmol/L  Final  . Potassium 04/21/2016 4.0  3.5 - 5.1 mmol/L Final  . Chloride 04/21/2016 107  101 - 111 mmol/L Final  . CO2 04/21/2016 25  22 - 32 mmol/L Final  . Glucose, Bld 04/21/2016 138* 65 - 99 mg/dL Final  . BUN 04/21/2016 10  6 - 20 mg/dL Final  . Creatinine, Ser 04/21/2016 0.76  0.44 - 1.00 mg/dL Final  . Calcium 04/21/2016 8.3* 8.9 - 10.3 mg/dL Final  . GFR calc non Af Amer 04/21/2016 >60  >60 mL/min Final  . GFR calc Af Amer 04/21/2016 >60  >60 mL/min Final   Comment: (NOTE) The eGFR has been calculated using the CKD EPI equation. This calculation has not been validated in all clinical situations. eGFR's persistently <60 mL/min signify possible Chronic Kidney Disease.   . Anion gap 04/21/2016 6  5 - 15 Final  . WBC 04/22/2016 12.6* 4.0 - 10.5 K/uL Final  . RBC 04/22/2016 3.95  3.87 - 5.11 MIL/uL Final  . Hemoglobin 04/22/2016 11.1* 12.0 - 15.0 g/dL Final  . HCT 04/22/2016 33.9* 36.0 - 46.0 % Final  . MCV 04/22/2016 85.8  78.0 - 100.0 fL Final  . MCH 04/22/2016 28.1  26.0 - 34.0 pg Final  . MCHC 04/22/2016 32.7  30.0 - 36.0 g/dL Final  . RDW 04/22/2016 14.6  11.5 - 15.5 % Final  . Platelets 04/22/2016 265  150 - 400 K/uL Final  . Sodium 04/22/2016 137  135 - 145 mmol/L Final  . Potassium 04/22/2016 4.1  3.5 - 5.1 mmol/L Final  . Chloride 04/22/2016 106  101 - 111 mmol/L Final  . CO2 04/22/2016 25  22 - 32 mmol/L Final  . Glucose, Bld 04/22/2016 126* 65 - 99 mg/dL Final  . BUN 04/22/2016 11  6 - 20 mg/dL Final  . Creatinine, Ser 04/22/2016 0.63  0.44 - 1.00 mg/dL Final  . Calcium 04/22/2016 8.5* 8.9 - 10.3 mg/dL Final  . GFR calc non Af Amer 04/22/2016 >60  >60 mL/min Final  . GFR calc Af Amer 04/22/2016 >60  >60 mL/min Final   Comment: (NOTE) The eGFR has been calculated using the CKD EPI equation. This calculation has not been validated in all clinical situations. eGFR's persistently <60 mL/min signify possible Chronic Kidney Disease.   Georgiann Hahn gap 04/22/2016 6  5 - 15  Final  Hospital Outpatient Visit on 04/10/2016  Component Date Value Ref Range Status  . aPTT 04/10/2016 36  24 - 36 seconds Final  . WBC 04/10/2016 7.5  4.0 - 10.5 K/uL Final  . RBC 04/10/2016 4.75  3.87 - 5.11 MIL/uL Final  . Hemoglobin 04/10/2016 13.1  12.0 - 15.0 g/dL Final  . HCT 04/10/2016 40.3  36.0 - 46.0 % Final  . MCV 04/10/2016 84.8  78.0 - 100.0 fL Final  . MCH 04/10/2016 27.6  26.0 - 34.0 pg Final  . MCHC 04/10/2016 32.5  30.0 - 36.0 g/dL Final  . RDW 04/10/2016 14.2  11.5 - 15.5 % Final  . Platelets 04/10/2016 257  150 - 400 K/uL Final  . Sodium 04/10/2016 138  135 - 145 mmol/L Final  . Potassium 04/10/2016 4.3  3.5 - 5.1 mmol/L Final  . Chloride 04/10/2016 105  101 - 111 mmol/L Final  . CO2 04/10/2016 25  22 - 32 mmol/L Final  . Glucose, Bld 04/10/2016 90  65 - 99 mg/dL Final  . BUN 04/10/2016 13  6 - 20 mg/dL Final  . Creatinine, Ser 04/10/2016 0.76  0.44 - 1.00 mg/dL Final  . Calcium 04/10/2016 9.0  8.9 - 10.3 mg/dL Final  . Total Protein 04/10/2016 7.0  6.5 - 8.1 g/dL Final  . Albumin 04/10/2016 3.9  3.5 - 5.0 g/dL Final  . AST 04/10/2016 21  15 - 41 U/L Final  . ALT 04/10/2016 19  14 - 54 U/L Final  . Alkaline Phosphatase 04/10/2016 98  38 - 126 U/L Final  . Total Bilirubin 04/10/2016 0.6  0.3 - 1.2 mg/dL Final  . GFR calc non Af Amer 04/10/2016 >60  >60 mL/min Final  . GFR calc Af Amer 04/10/2016 >60  >60 mL/min Final   Comment: (NOTE) The eGFR has been calculated using the CKD EPI equation. This calculation has not been validated in all clinical situations. eGFR's persistently <60 mL/min signify possible Chronic Kidney Disease.   . Anion gap 04/10/2016 8  5 - 15 Final  . Prothrombin Time 04/10/2016 12.8  11.4 - 15.2 seconds Final  . INR 04/10/2016 0.96   Final  . ABO/RH(D) 04/20/2016 O POS   Final  . Antibody Screen 04/20/2016 NEG   Final  . Sample Expiration 04/20/2016 04/23/2016   Final  . Extend sample reason 04/20/2016 NO TRANSFUSIONS OR PREGNANCY IN  THE PAST 3 MONTHS   Final  . Color, Urine 04/10/2016 YELLOW  YELLOW Final  . APPearance 04/10/2016 CLEAR  CLEAR Final  . Specific Gravity, Urine 04/10/2016 1.010  1.005 - 1.030 Final  .  pH 04/10/2016 5.5  5.0 - 8.0 Final  . Glucose, UA 04/10/2016 NEGATIVE  NEGATIVE mg/dL Final  . Hgb urine dipstick 04/10/2016 NEGATIVE  NEGATIVE Final  . Bilirubin Urine 04/10/2016 NEGATIVE  NEGATIVE Final  . Ketones, ur 04/10/2016 NEGATIVE  NEGATIVE mg/dL Final  . Protein, ur 04/10/2016 NEGATIVE  NEGATIVE mg/dL Final  . Nitrite 04/10/2016 NEGATIVE  NEGATIVE Final  . Leukocytes, UA 04/10/2016 NEGATIVE  NEGATIVE Final  . MRSA, PCR 04/10/2016 NEGATIVE  NEGATIVE Final  . Staphylococcus aureus 04/10/2016 POSITIVE* NEGATIVE Final   Comment:        The Xpert SA Assay (FDA approved for NASAL specimens in patients over 104 years of age), is one component of a comprehensive surveillance program.  Test performance has been validated by [facility] for patients greater than or equal to 58 year old. It is not intended to diagnose infection nor to guide or monitor treatment.   . ABO/RH(D) 04/10/2016 O POS   Final     X-Rays:Dg Bone Density  Result Date: 04/16/2016 EXAM: DUAL X-RAY ABSORPTIOMETRY (DXA) FOR BONE MINERAL DENSITY IMPRESSION: Dear Dr. Marlyn Corporal, Your patient Skyah Hannon completed a BMD test on 04/16/2016 using the [place] (analysis version: 14.10) manufactured by [employer]. The following summarizes the results of our evaluation. PATIENT BIOGRAPHICAL: Name: Preslea, Rhodus Patient ID: [id number] Birth Date: [birth year] Height: 62.0 in. Gender: Female Exam Date: 04/16/2016 Weight: 225.0 lbs. Indications: Advanced Age, Caucasian, Height Loss, Postmenopausal Fractures: Treatments: omeprazole, Vit D ASSESSMENT: The BMD measured at Femur Neck Right is 0.760 g/cm2 with a T-score of -2.0. This patient is considered osteopenic according to [facility]) criteria. L-3 & 4 was  excluded due to degenerative changes. Site Region Measured Measured WHO Young Adult BMD Date       Age      Classification T-score AP Spine L1-L2 3/16/2016 71.2 Normal -0.3 1.136 g/cm2 AP Spine L1-L2 03[birth year]12/2014 69.1 Normal -0.6 1.107 g/cm2 DualFemur Neck Right 04/16/2016 71.2 Osteopenia -2.0 0.760 g/cm2 DualFemur Neck Right [birth year]12/2014 69.1 Osteopenia -2.1 0.751 g/cm2 World Health Organization [facility]) criteria for post-menopausal, Caucasian Women: Normal:       T-score at or above -1 SD Osteopenia:   T-score between -1 and -2.5 SD Osteoporosis: T-score at or below -2.5 SD RECOMMENDATIONS: [place] recommends that FDA-approved medical therapies be considered in postmenopausal women and men age 17 or older with a: 1. Hip or vertebral (clinical or morphometric) fracture. 2. T-score of < -2.5 at the spine or hip. 3. Ten-year fracture probability by FRAX of 3% or greater for hip fracture or 20% or greater for major osteoporotic fracture. All treatment decisions require clinical judgment and consideration of individual patient factors, including patient preferences, co-morbidities, previous drug use, risk factors not captured in the FRAX model (e.g. falls, vitamin D deficiency, increased bone turnover, interval significant decline in bone density) and possible under - or over-estimation of fracture risk by FRAX. All patients should ensure an adequate intake of dietary calcium (1200 mg/d) and vitamin D (800 IU daily) unless contraindicated. FOLLOW-UP: People with diagnosed cases of osteoporosis or at high risk for fracture should have regular bone mineral density tests. For patients eligible for Medicare, routine testing is allowed once every 2 years. The testing frequency can be increased to one year for patients who have rapidly progressing disease, those who are receiving or discontinuing medical therapy to restore bone mass, or have additional risk factors. I have reviewed this report, and agree  with the  above findings. [facility] Dear Dr. Marlyn Corporal, Your patient Zaia Carre completed a FRAX assessment on 04/16/2016 using the [place] (analysis version: 14.10) manufactured by [employer]. The following summarizes the results of our evaluation. PATIENT BIOGRAPHICAL: Name: Monette, Omara Patient ID: [id number] Birth Date: [birth year] Height:    62.0 in. Gender:     [age]    [age]       Weight:    225.0 lbs. Ethnicity:  White                            Exam Date: 04/16/2016 FRAX* RESULTS:  (version: 3.5) 10-year Probability of Fracture1 Major Osteoporotic Fracture2 Hip Fracture 10.7% 2.1% Population: [place] (Caucasian) Risk Factors: None Based on Femur (Right) Neck BMD 1 -The 10-year probability of fracture may be lower than reported if the patient has received treatment. 2 -Major Osteoporotic Fracture: Clinical Spine, Forearm, Hip or Shoulder *FRAX is a Materials engineer of the [employer] for Metabolic Bone Disease, a [place] (WHO) [facility]. ASSESSMENT: The probability of a major osteoporotic fracture is 10.7% within the next ten years. The probability of a hip fracture is 2.1% within the next ten years. . Electronically Signed   By: Lahoma Crocker M.D.   On: 04/16/2016 14:44   Mm Screening Breast Tomo Bilateral  Result Date: 04/20/2016 CLINICAL DATA:  Screening. EXAM: 2D DIGITAL SCREENING BILATERAL MAMMOGRAM WITH CAD AND ADJUNCT TOMO COMPARISON:  Previous exam(s). ACR Breast Density Category b: There are scattered areas of fibroglandular density. FINDINGS: There are no findings suspicious for malignancy. Images were processed with CAD. IMPRESSION: No mammographic evidence of malignancy. A result letter of this screening mammogram will be mailed directly to the patient. RECOMMENDATION: Screening mammogram in one year. (Code:SM-B-01Y) BI-RADS CATEGORY  1: Negative. Electronically Signed   By: Margarette [place] M.D.   On:  04/20/2016 15:30    EKG:No orders found for this or any previous visit.   Hospital Course: Julio Storr is a 70 y.o. who was admitted to [facility]. They were brought to the operating room on 04/20/2016 and underwent Procedure(s): RIGHT TOTAL KNEE ARTHROPLASTY.  Patient tolerated the procedure well and was later transferred to the recovery room and then to the orthopaedic floor for postoperative care.  They were given PO and IV analgesics for pain control following their surgery.  They were given 24 hours of postoperative antibiotics of  Anti-infectives    Start     Dose/Rate Route Frequency Ordered Stop   04/20/16 1400  ceFAZolin (ANCEF) IVPB 2g/100 mL premix     2 g 200 mL/hr over 30 Minutes Intravenous Every 6 hours 04/20/16 1014 04/20/16 2044   04/20/16 0545  ceFAZolin (ANCEF) IVPB 2g/100 mL premix     2 g 200 mL/hr over 30 Minutes Intravenous On call to O.R. 04/20/16 2774 04/20/16 0708     and started on DVT prophylaxis in the form of Xarelto.   PT and OT were ordered for total joint protocol.  Discharge planning consulted to help with postop disposition and equipment needs.  Patient had a decent night on the evening of surgery.  They started to get up OOB with therapy on day one. Hemovac drain was pulled without difficulty.  Continued to work with therapy into day two.  Dressing was changed on day two and the incision was healing well.   Patient  was seen in rounds and was ready to go home.   Discharge home with home health Diet - Cardiac diet Follow up - in 2 weeks Activity - WBAT Disposition - Home Condition Upon Discharge - Good D/C Meds - See DC Summary DVT Prophylaxis - Xarelto   Discharge Instructions    Call MD / Call 911    Complete by:  As directed    If you experience chest pain or shortness of breath, CALL 911 and be transported to the hospital emergency room.  If you develope a fever above 101 F, pus (white drainage) or increased drainage or redness at the  wound, or calf pain, call your surgeon's office.   Change dressing    Complete by:  As directed    Change dressing daily with sterile 4 x 4 inch gauze dressing and apply TED hose. Do not submerge the incision under water.   Constipation Prevention    Complete by:  As directed    Drink plenty of fluids.  Prune juice may be helpful.  You may use a stool softener, such as Colace (over the counter) 100 mg twice a day.  Use MiraLax (over the counter) for constipation as needed.   Diet general    Complete by:  As directed    Discharge instructions    Complete by:  As directed    Pick up stool softner and laxative for home use following surgery while on pain medications. Do not submerge incision under water. Please use good hand washing techniques while changing dressing each day. May shower starting three days after surgery. Please use a clean towel to pat the incision dry following showers. Continue to use ice for pain and swelling after surgery. Do not use any lotions or creams on the incision until instructed by your surgeon.   Postoperative Constipation Protocol  Constipation - defined medically as fewer than three stools per week and severe constipation as less than one stool per week.  One of the most common issues patients have following surgery is constipation.  Even if you have a regular bowel pattern at home, your normal regimen is likely to be disrupted due to multiple reasons following surgery.  Combination of anesthesia, postoperative narcotics, change in appetite and fluid intake all can affect your bowels.  In order to avoid complications following surgery, here are some recommendations in order to help you during your recovery period.  Colace (docusate) - Pick up an over-the-counter form of Colace or another stool softener and take twice a day as long as you are requiring postoperative pain medications.  Take with a full glass of water daily.  If you experience loose stools or  diarrhea, hold the colace until you stool forms back up.  If your symptoms do not get better within 1 week or if they get worse, check with your doctor.  Dulcolax (bisacodyl) - Pick up over-the-counter and take as directed by the product packaging as needed to assist with the movement of your bowels.  Take with a full glass of water.  Use this product as needed if not relieved by Colace only.   MiraLax (polyethylene glycol) - Pick up over-the-counter to have on hand.  MiraLax is a solution that will increase the amount of water in your bowels to assist with bowel movements.  Take as directed and can mix with a glass of water, juice, soda, coffee, or tea.  Take if you go more than two days without a movement. Do not  use MiraLax more than once per day. Call your doctor if you are still constipated or irregular after using this medication for 7 days in a row.  If you continue to have problems with postoperative constipation, please contact the office for further assistance and recommendations.  If you experience "the worst abdominal pain ever" or develop nausea or vomiting, please contact the office immediatly for further recommendations for treatment.   Take Xarelto for two and a half more weeks, then discontinue Xarelto. Once the patient has completed the blood thinner regimen, then take a Baby 81 mg Aspirin daily for three more weeks.   Do not put a pillow under the knee. Place it under the heel.    Complete by:  As directed    Do not sit on low chairs, stoools or toilet seats, as it may be difficult to get up from low surfaces    Complete by:  As directed    Driving restrictions    Complete by:  As directed    No driving until released by the physician.   Increase activity slowly as tolerated    Complete by:  As directed    Lifting restrictions    Complete by:  As directed    No lifting until released by the physician.   Patient may shower    Complete by:  As directed    You may shower  without a dressing once there is no drainage.  Do not wash over the wound.  If drainage remains, do not shower until drainage stops.   TED hose    Complete by:  As directed    Use stockings (TED hose) for 3 weeks on both leg(s).  You may remove them at night for sleeping.   Weight bearing as tolerated    Complete by:  As directed    Laterality:  right   Extremity:  Lower       Medication List    STOP taking these medications   cholecalciferol 1000 units tablet Commonly known as:  VITAMIN D     TAKE these medications   furosemide 20 MG tablet Commonly known as:  LASIX Take 20 mg by mouth daily as needed for fluid.   HYDROmorphone 2 MG tablet Commonly known as:  DILAUDID Take 1-2 tablets (2-4 mg total) by mouth every 3 (three) hours as needed for severe pain.   ketoconazole 2 % cream Commonly known as:  NIZORAL Apply 1 application topically daily. To affected areas. What changed:  when to take this  reasons to take this  additional instructions   methocarbamol 500 MG tablet Commonly known as:  ROBAXIN Take 1 tablet (500 mg total) by mouth every 6 (six) hours as needed for muscle spasms.   omeprazole 20 MG capsule Commonly known as:  PRILOSEC Take 1 capsule (20 mg total) by mouth 2 (two) times daily before a meal.   rivaroxaban 10 MG Tabs tablet Commonly known as:  XARELTO Take 1 tablet (10 mg total) by mouth daily with breakfast. Take Xarelto for two and a half more weeks, then discontinue Xarelto. Once the patient has completed the blood thinner regimen, then take a Baby 81 mg Aspirin daily for three more weeks.   traMADol 50 MG tablet Commonly known as:  ULTRAM Take 1-2 tablets (50-100 mg total) by mouth every 6 (six) hours as needed (mild pain). What changed:  how much to take  reasons to take this      Follow-up Information  [facility] Follow up.   Why:  home health physical therapy Contact information: [place] [address] [phone]        Gearlean Alf, MD. Schedule an appointment as soon as possible for a visit on 05/06/2016.   Specialty:  Orthopedic Surgery Why:  Will need to be seen by PA on the afternoon of Wednesday 05/06/2016.  Call for appointment time. Contact information: [address] [place] [address] [phone]           Signed: Arlee Muslim, PA-C Orthopaedic Surgery 04/22/2016, 12:58 PM

## 2016-04-21 NOTE — Discharge Instructions (Addendum)
° °Dr. Frank Aluisio °Total Joint Specialist °[facility] °[address] °[place], [place] [address] °[phone] ° °TOTAL KNEE REPLACEMENT POSTOPERATIVE DIRECTIONS ° °Knee Rehabilitation, Guidelines Following Surgery  °Results after knee surgery are often greatly improved when you follow the exercise, range of motion and muscle strengthening exercises prescribed by your doctor. Safety measures are also important to protect the knee from further injury. Any time any of these exercises cause you to have increased pain or swelling in your knee joint, decrease the amount until you are comfortable again and slowly increase them. If you have problems or questions, call your caregiver or physical therapist for advice.  ° °HOME CARE INSTRUCTIONS  °Remove items at home which could result in a fall. This includes throw rugs or furniture in walking pathways.  °· ICE to the affected knee every three hours for 30 minutes at a time and then as needed for pain and swelling.  Continue to use ice on the knee for pain and swelling from surgery. You may notice swelling that will progress down to the foot and ankle.  This is normal after surgery.  Elevate the leg when you are not up walking on it.   °· Continue to use the breathing machine which will help keep your temperature down.  It is common for your temperature to cycle up and down following surgery, especially at night when you are not up moving around and exerting yourself.  The breathing machine keeps your lungs expanded and your temperature down. °· Do not place pillow under knee, focus on keeping the knee straight while resting ° °DIET °You may resume your previous home diet once your are discharged from the hospital. ° °DRESSING / WOUND CARE / SHOWERING °You may shower 3 days after surgery, but keep the wounds dry during showering.  You may use an occlusive plastic wrap (Press'n Seal for example), NO SOAKING/SUBMERGING IN THE BATHTUB.  If the  bandage gets wet, change with a clean dry gauze.  If the incision gets wet, pat the wound dry with a clean towel. °You may start showering once you are discharged home but do not submerge the incision under water. Just pat the incision dry and apply a dry gauze dressing on daily. °Change the surgical dressing daily and reapply a dry dressing each time. ° °ACTIVITY °Walk with your walker as instructed. °Use walker as long as suggested by your caregivers. °Avoid periods of inactivity such as sitting longer than an hour when not asleep. This helps prevent blood clots.  °You may resume a sexual relationship in one month or when given the OK by your doctor.  °You may return to work once you are cleared by your doctor.  °Do not drive a car for 6 weeks or until released by you surgeon.  °Do not drive while taking narcotics. ° °WEIGHT BEARING °Weight bearing as tolerated with assist device (walker, cane, etc) as directed, use it as long as suggested by your surgeon or therapist, typically at least 4-6 weeks. ° °POSTOPERATIVE CONSTIPATION PROTOCOL °Constipation - defined medically as fewer than three stools per week and severe constipation as less than one stool per week. ° °One of the most common issues patients have following surgery is constipation.  Even if you have a regular bowel pattern at home, your normal regimen is likely to be disrupted due to multiple reasons following surgery.  Combination of anesthesia, postoperative narcotics, change in appetite and fluid intake all can affect your bowels.    In order to avoid complications following surgery, here are some recommendations in order to help you during your recovery period. ° °Colace (docusate) - Pick up an over-the-counter form of Colace or another stool softener and take twice a day as long as you are requiring postoperative pain medications.  Take with a full glass of water daily.  If you experience loose stools or diarrhea, hold the colace until you stool forms  back up.  If your symptoms do not get better within 1 week or if they get worse, check with your doctor. ° °Dulcolax (bisacodyl) - Pick up over-the-counter and take as directed by the product packaging as needed to assist with the movement of your bowels.  Take with a full glass of water.  Use this product as needed if not relieved by Colace only.  ° °MiraLax (polyethylene glycol) - Pick up over-the-counter to have on hand.  MiraLax is a solution that will increase the amount of water in your bowels to assist with bowel movements.  Take as directed and can mix with a glass of water, juice, soda, coffee, or tea.  Take if you go more than two days without a movement. °Do not use MiraLax more than once per day. Call your doctor if you are still constipated or irregular after using this medication for 7 days in a row. ° °If you continue to have problems with postoperative constipation, please contact the office for further assistance and recommendations.  If you experience "the worst abdominal pain ever" or develop nausea or vomiting, please contact the office immediatly for further recommendations for treatment. ° °ITCHING ° If you experience itching with your medications, try taking only a single pain pill, or even half a pain pill at a time.  You can also use Benadryl over the counter for itching or also to help with sleep.  ° °TED HOSE STOCKINGS °Wear the elastic stockings on both legs for three weeks following surgery during the day but you may remove then at night for sleeping. ° °MEDICATIONS °See your medication summary on the “After Visit Summary” that the nursing staff will review with you prior to discharge.  You may have some home medications which will be placed on hold until you complete the course of blood thinner medication.  It is important for you to complete the blood thinner medication as prescribed by your surgeon.  Continue your approved medications as instructed at time of  discharge. ° °PRECAUTIONS °If you experience chest pain or shortness of breath - call 911 immediately for transfer to the hospital emergency department.  °If you develop a fever greater that 101 F, purulent drainage from wound, increased redness or drainage from wound, foul odor from the wound/dressing, or calf pain - CONTACT YOUR SURGEON.   °                                                °FOLLOW-UP APPOINTMENTS °Make sure you keep all of your appointments after your operation with your surgeon and caregivers. You should call the office at the above phone number and make an appointment for approximately two weeks after the date of your surgery or on the date instructed by your surgeon outlined in the "After Visit Summary". ° ° °RANGE OF MOTION AND STRENGTHENING EXERCISES  °Rehabilitation of the knee is important following a knee injury or   an operation. After just a few days of immobilization, the muscles of the thigh which control the knee become weakened and shrink (atrophy). Knee exercises are designed to build up the tone and strength of the thigh muscles and to improve knee motion. Often times heat used for twenty to thirty minutes before working out will loosen up your tissues and help with improving the range of motion but do not use heat for the first two weeks following surgery. These exercises can be done on a training (exercise) mat, on the floor, on a table or on a bed. Use what ever works the best and is most comfortable for you Knee exercises include:  °Leg Lifts - While your knee is still immobilized in a splint or cast, you can do straight leg raises. Lift the leg to 60 degrees, hold for 3 sec, and slowly lower the leg. Repeat 10-20 times 2-3 times daily. Perform this exercise against resistance later as your knee gets better.  °Quad and Hamstring Sets - Tighten up the muscle on the front of the thigh (Quad) and hold for 5-10 sec. Repeat this 10-20 times hourly. Hamstring sets are done by pushing the  foot backward against an object and holding for 5-10 sec. Repeat as with quad sets.  °· Leg Slides: Lying on your back, slowly slide your foot toward your buttocks, bending your knee up off the floor (only go as far as is comfortable). Then slowly slide your foot back down until your leg is flat on the floor again. °· Angel Wings: Lying on your back spread your legs to the side as far apart as you can without causing discomfort.  °A rehabilitation program following serious knee injuries can speed recovery and prevent re-injury in the future due to weakened muscles. Contact your doctor or a physical therapist for more information on knee rehabilitation.  ° °IF YOU ARE TRANSFERRED TO A SKILLED REHAB FACILITY °If the patient is transferred to a skilled rehab facility following release from the hospital, a list of the current medications will be sent to the facility for the patient to continue.  When discharged from the skilled rehab facility, please have the facility set up the patient's Home Health Physical Therapy prior to being released. Also, the skilled facility will be responsible for providing the patient with their medications at time of release from the facility to include their pain medication, the muscle relaxants, and their blood thinner medication. If the patient is still at the rehab facility at time of the two week follow up appointment, the skilled rehab facility will also need to assist the patient in arranging follow up appointment in our office and any transportation needs. ° °MAKE SURE YOU:  °Understand these instructions.  °Get help right away if you are not doing well or get worse.  ° ° °Pick up stool softner and laxative for home use following surgery while on pain medications. °Do not submerge incision under water. °Please use good hand washing techniques while changing dressing each day. °May shower starting three days after surgery. °Please use a clean towel to pat the incision dry following  showers. °Continue to use ice for pain and swelling after surgery. °Do not use any lotions or creams on the incision until instructed by your surgeon. ° °Take Xarelto for two and a half more weeks, then discontinue Xarelto. °Once the patient has completed the blood thinner regimen, then take a Baby 81 mg Aspirin daily for three more weeks. ° ° °Information   on my medicine - XARELTO® (Rivaroxaban) ° °This medication education was reviewed with me or my healthcare representative as part of my discharge preparation.  The pharmacist that spoke with me during my hospital stay was:  Glogovac,nikola, RPH ° °Why was Xarelto® prescribed for you? °Xarelto® was prescribed for you to reduce the risk of blood clots forming after orthopedic surgery. The medical term for these abnormal blood clots is venous thromboembolism (VTE). ° °What do you need to know about xarelto® ? °Take your Xarelto® ONCE DAILY at the same time every day. °You may take it either with or without food. ° °If you have difficulty swallowing the tablet whole, you may crush it and mix in applesauce just prior to taking your dose. ° °Take Xarelto® exactly as prescribed by your doctor and DO NOT stop taking Xarelto® without talking to the doctor who prescribed the medication.  Stopping without other VTE prevention medication to take the place of Xarelto® may increase your risk of developing a clot. ° °After discharge, you should have regular check-up appointments with your healthcare provider that is prescribing your Xarelto®.   ° °What do you do if you miss a dose? °If you miss a dose, take it as soon as you remember on the same day then continue your regularly scheduled once daily regimen the next day. Do not take two doses of Xarelto® on the same day.  ° °Important Safety Information °A possible side effect of Xarelto® is bleeding. You should call your healthcare provider right away if you experience any of the following: °? Bleeding from an injury or your  nose that does not stop. °? Unusual colored urine (red or dark brown) or unusual colored stools (red or black). °? Unusual bruising for unknown reasons. °? A serious fall or if you hit your head (even if there is no bleeding). ° °Some medicines may interact with Xarelto® and might increase your risk of bleeding while on Xarelto®. To help avoid this, consult your healthcare provider or pharmacist prior to using any new prescription or non-prescription medications, including herbals, vitamins, non-steroidal anti-inflammatory drugs (NSAIDs) and supplements. ° °This website has more information on Xarelto®: www.xarelto.com. ° ° °

## 2016-04-21 NOTE — Care Management Note (Signed)
Case Management Note  Patient Details  Name: Miyanna Wiersma MRN: [id number] Date of Birth: May 14, 1945  Subjective/Objective:                  Right  Total Knee Arthroplasty Action/Plan: Discharge planning Expected Discharge Date:  04/22/16               Expected Discharge Plan:  [place]  In-House Referral:     Discharge planning Services  CM Consult  Post Acute Care Choice:  Home Health Choice offered to:  Patient  DME Arranged:  N/A DME Agency:  NA  HH Arranged:  PT [place] Agency:  [facility]  Status of Service:  Completed, signed off  If discussed at [place] of Stay Meetings, dates discussed:    Additional Comments: CM met with pt in room to offer choice of home health agency. Pt chooses [facility] to render HHPT.  Referral called to [employer] rep, Manuela Schwartz.  Pt states she has all DME at home.  No oher CM needs were communicated. Dellie Catholic, RN 04/21/2016, 12:59 PM

## 2016-04-21 NOTE — Progress Notes (Signed)
   Subjective: 1 Day Post-Op Procedure(s) (LRB): RIGHT TOTAL KNEE ARTHROPLASTY (Right) Patient reports pain as mild.   Patient seen in rounds for Dr. Wynelle Link. Patient is well, but has had some minor complaints of pain in the knee, requiring pain medications We will start therapy today.  Plan is to go Home after hospital stay.  Objective: Vital signs in last 24 hours: Temp:  [98.2 F (36.8 C)-99.2 F (37.3 C)] 98.3 F (36.8 C) (12/12 2108) Pulse Rate:  [66-70] 70 (12/12 2108) Resp:  [16-18] 18 (12/12 2108) BP: (143-178)/(64-71) 178/69 (12/12 2108) SpO2:  [96 %-98 %] 98 % (12/12 2108)  Intake/Output from previous day:  Intake/Output Summary (Last 24 hours) at 04/21/16 2224 Last data filed at 04/21/16 2108  Gross per 24 hour  Intake          1716.17 ml  Output             1300 ml  Net           416.17 ml    Intake/Output this shift: Total I/O In: 120 [P.O.:120] Out: 300 [Urine:300]  Labs:  Recent Labs  04/21/16 0449  HGB 11.3*    Recent Labs  04/21/16 0449  WBC 12.1*  RBC 4.14  HCT 35.6*  PLT 285    Recent Labs  04/21/16 0449  NA 138  K 4.0  CL 107  CO2 25  BUN 10  CREATININE 0.76  GLUCOSE 138*  CALCIUM 8.3*   No results for input(s): LABPT, INR in the last 72 hours.  EXAM General - Patient is Alert, Appropriate and Oriented Extremity - Neurovascular intact Sensation intact distally Intact pulses distally Dorsiflexion/Plantar flexion intact Dressing - dressing C/D/I Motor Function - intact, moving foot and toes well on exam.  Hemovac pulled without difficulty.  Past Medical History:  Diagnosis Date  . Allergy   . Arthritis    OA  . 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 GERD (gastroesophageal reflux disease)   . Swelling of lower extremity    RARELY SWELLS    Assessment/Plan: 1 Day Post-Op Procedure(s) (LRB): RIGHT TOTAL KNEE ARTHROPLASTY (Right) Principal Problem:   OA (osteoarthritis) of knee  Estimated body  mass index is 39.54 kg/m as calculated from the following:   Height as of this encounter: 5\' 3"  (1.6 m).   Weight as of this encounter: 101.2 kg (223 lb 3.2 oz). Advance diet Up with therapy Plan for discharge tomorrow Discharge home with home health  DVT Prophylaxis - Xarelto Weight-Bearing as tolerated to right leg D/C O2 and Pulse OX and try on Room Air  Arlee Muslim, PA-C Orthopaedic Surgery 04/21/2016, 10:24 PM

## 2016-04-21 NOTE — Progress Notes (Signed)
   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  Precautions  Precautions Knee  Precaution Comments reviewed no pillow under knee  Required Braces or Orthoses Knee Immobilizer - Right  Restrictions  RLE Weight Bearing WBAT  Pain Assessment  Pain Assessment 0-10  Pain Score 4  Pain Location R knee  Pain Descriptors / Indicators Sore  Cognition  Arousal/Alertness Awake/alert  Behavior During Therapy WFL for tasks assessed/performed  Overall Cognitive Status Within Functional Limits for tasks assessed  Bed Mobility  Overal bed mobility Needs Assistance  Bed Mobility Sit to Supine  Sit to supine Min guard  General bed mobility comments cues for positioning, light assist with RLE  Transfers  Overall transfer level Needs assistance  Equipment used Rolling walker (2 wheeled)  Transfers Sit to/from Stand  Sit to Stand Min guard  General transfer comment VCs hand placement  Ambulation/Gait  Ambulation/Gait assistance Min guard  Ambulation Distance (Feet) 100 Feet  Assistive device Rolling walker (2 wheeled)  Gait Pattern/deviations Step-to pattern;Decreased step length - right;Antalgic  General Gait Details VCs sequencing and positioning in RW  Balance  Sitting-balance support Feet supported;No upper extremity supported  Standing balance support During functional activity;Single extremity supported  Standing balance-Leahy Scale Fair (to good)  Standing balance comment no LOB, unilateral UE support during toileting/hygiene, close superivision for safety  Total Joint Exercises  Ankle Circles/Pumps AROM;Both;10 reps  Quad Sets AROM;Both;10 reps  Heel Slides AAROM;Right;5 reps  Straight Leg Raises AROM;AAROM;Strengthening;Right;10 reps  PT - End of Session  Activity Tolerance Patient tolerated treatment well   Patient left in bed;with call bell/phone within reach;with bed alarm set  Nurse Communication Mobility status  PT - Assessment/Plan  PT Plan Current plan remains appropriate  PT Frequency (ACUTE ONLY) 7X/week  Follow Up Recommendations Home health PT  PT equipment None recommended by PT  PT Goal Progression  Progress towards PT goals Progressing toward goals  Acute Rehab PT Goals  PT Goal Formulation With patient/family  Time For Goal Achievement 04/27/16  Potential to Achieve Goals Good  PT Time Calculation  PT Start Time (ACUTE ONLY) 1339  PT Stop Time (ACUTE ONLY) 1355  PT Time Calculation (min) (ACUTE ONLY) 16 min  PT General Charges  $$ ACUTE PT VISIT 1 Procedure  PT Treatments  $Gait Training 8-22 mins

## 2016-04-21 NOTE — Evaluation (Signed)
Occupational Therapy Evaluation Patient Details Name: Amanda Carpenter MRN: [id number] DOB: Sep 09, 1945 Today's Date: 04/21/2016    History of Present Illness R TKA, h/o L TKA 2015   Clinical Impression   This 70 year old female was admitted for the above sx. All education was completed. No further OT is needed at this time    Follow Up Recommendations  No OT follow up    Equipment Recommendations  None recommended by OT    Recommendations for Other Services       Precautions / Restrictions Precautions Precautions: Knee Precaution Comments: reviewed no pillow under knee Required Braces or Orthoses: Knee Immobilizer - Right Restrictions Weight Bearing Restrictions: No RLE Weight Bearing: Weight bearing as tolerated      Mobility Bed Mobility Overal bed mobility: Needs Assistance Bed Mobility: Supine to Sit     Supine to sit: Supervision     General bed mobility comments: done by PT; OOB  Transfers Overall transfer level: Needs assistance Equipment used: Rolling walker (2 wheeled) Transfers: Sit to/from Stand Sit to Stand: Min guard         General transfer comment: for safety:  cues for UE placement    Balance                                            ADL Overall ADL's : Needs assistance/impaired     Grooming: Oral care;Supervision/safety;Standing       Lower Body Bathing: Minimal assistance;Sit to/from stand;With adaptive equipment       Lower Body Dressing: Minimal assistance;Sit to/from stand;With adaptive equipment   Toilet Transfer: Min guard;Ambulation;BSC;RW   Toileting- Water quality scientist and Hygiene: Min guard;Sit to/from stand   Tub/ Shower Transfer: Min guard;Walk-in shower (simulated ledge)     General ADL Comments: ambulated to bathroom with min guard.  Pt has a reacher and long sponge at home     Vision     Perception     Praxis      Pertinent Vitals/Pain Pain Assessment: 0-10 Pain Score: 4   Pain Location: R knee Pain Descriptors / Indicators: Sore Pain Intervention(s): Limited activity within patient's tolerance;Monitored during session;Repositioned;Premedicated before session;Ice applied     Hand Dominance     Extremity/Trunk Assessment Upper Extremity Assessment Upper Extremity Assessment: Overall WFL for tasks assessed           Communication Communication Communication: No difficulties   Cognition Arousal/Alertness: Awake/alert Behavior During Therapy: WFL for tasks assessed/performed Overall Cognitive Status: Within Functional Limits for tasks assessed                     General Comments       Exercises       Shoulder Instructions      Home Living Family/patient expects to be discharged to:: Private residence Living Arrangements: Parent Available Help at Discharge: Family;Available 24 hours/day               Bathroom Shower/Tub: Occupational psychologist: Handicapped height     Home Equipment: Crutches;Walker - 2 wheels;Grab bars - toilet;Grab bars - tub/shower;Shower seat - built in          Prior Functioning/Environment Level of Independence: Independent                 OT Problem List:     OT Treatment/Interventions:  OT Goals(Current goals can be found in the care plan section) Acute Rehab OT Goals 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

## 2016-04-22 LAB — CBC
HCT: 33.9 % — ABNORMAL LOW (ref 36.0–46.0)
Hemoglobin: 11.1 g/dL — ABNORMAL LOW (ref 12.0–15.0)
MCH: 28.1 pg (ref 26.0–34.0)
MCHC: 32.7 g/dL (ref 30.0–36.0)
MCV: 85.8 fL (ref 78.0–100.0)
PLATELETS: 265 10*3/uL (ref 150–400)
RBC: 3.95 MIL/uL (ref 3.87–5.11)
RDW: 14.6 % (ref 11.5–15.5)
WBC: 12.6 10*3/uL — ABNORMAL HIGH (ref 4.0–10.5)

## 2016-04-22 LAB — BASIC METABOLIC PANEL
Anion gap: 6 (ref 5–15)
BUN: 11 mg/dL (ref 6–20)
CALCIUM: 8.5 mg/dL — AB (ref 8.9–10.3)
CO2: 25 mmol/L (ref 22–32)
CREATININE: 0.63 mg/dL (ref 0.44–1.00)
Chloride: 106 mmol/L (ref 101–111)
GFR calc Af Amer: >60 mL/min (ref >60)
GFR calc non Af Amer: >60 mL/min (ref >60)
Glucose, Bld: 126 mg/dL — ABNORMAL HIGH (ref 65–99)
Potassium: 4.1 mmol/L (ref 3.5–5.1)
SODIUM: 137 mmol/L (ref 135–145)

## 2016-04-22 NOTE — Progress Notes (Signed)
Physical Therapy Treatment Patient Details Name: Amanda Carpenter MRN: [id number] DOB: 19-Apr-1946 Today's Date: 05-11-2016    History of Present Illness R TKA, h/o L TKA 2015    PT Comments    Progressing well, incr gait distance today, pt feels comfortable with stairs, ready for D/C from PT standpoint  Follow Up Recommendations  Home health PT     Equipment Recommendations  None recommended by PT    Recommendations for Other Services       Precautions / Restrictions Restrictions Weight Bearing Restrictions: No RLE Weight Bearing: Weight bearing as tolerated    Mobility  Bed Mobility Overal bed mobility: Needs Assistance Bed Mobility: Supine to Sit     Supine to sit: Supervision;Modified independent (Device/Increase time)     General bed mobility comments: for safety, slightly incr time  Transfers Overall transfer level: Needs assistance Equipment used: Rolling walker (2 wheeled) Transfers: Sit to/from Stand Sit to Stand: Min guard         General transfer comment: VCs hand placement  Ambulation/Gait Ambulation/Gait assistance: Min guard;Supervision Ambulation Distance (Feet): 200 Feet Assistive device: Rolling walker (2 wheeled) Gait Pattern/deviations: Step-to pattern;Decreased step length - right;Antalgic     General Gait Details: deviations improved with distance, incr step length and wt shift to right last 75'   Stairs Stairs: Yes   Stair Management: Two rails;Step to pattern;Forwards Number of Stairs: 3 General stair comments: cues for sequence, only light use of rails  Wheelchair Mobility    Modified Rankin (Stroke Patients Only)       Balance                                    Cognition Arousal/Alertness: Awake/alert Behavior During Therapy: WFL for tasks assessed/performed Overall Cognitive Status: Within Functional Limits for tasks assessed                      Exercises Total Joint Exercises Ankle  Circles/Pumps:  (reviewed importance of, on phone with family-feels she isIND)    General Comments        Pertinent Vitals/Pain Pain Assessment: 0-10 Pain Score: 3  Pain Location: R knee Pain Descriptors / Indicators: Sore Pain Intervention(s): Monitored during session;Ice applied    Home Living                      Prior Function            PT Goals (current goals can now be found in the care plan section) Acute Rehab PT Goals 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

## 2016-04-22 NOTE — Progress Notes (Signed)
   Subjective: 2 Days Post-Op Procedure(s) (LRB): RIGHT TOTAL KNEE ARTHROPLASTY (Right) Patient reports pain as mild.   Patient seen in rounds with Dr. Wynelle Link. Patient is well, and has had no acute complaints or problems Patient is ready to go home  Objective: Vital signs in last 24 hours: Temp:  [98 F (36.7 C)-99.2 F (37.3 C)] 98 F (36.7 C) (12/13 0525) Pulse Rate:  [66-75] 75 (12/13 0525) Resp:  [18] 18 (12/13 0525) BP: (143-178)/(64-76) 164/76 (12/13 0525) SpO2:  [97 %-98 %] 97 % (12/13 0525)  Intake/Output from previous day:  Intake/Output Summary (Last 24 hours) at 04/22/16 0748 Last data filed at 04/22/16 0526  Gross per 24 hour  Intake          1308.67 ml  Output              850 ml  Net           458.67 ml    Intake/Output this shift: No intake/output data recorded.  Labs:  Recent Labs  04/21/16 0449 04/22/16 0423  HGB 11.3* 11.1*    Recent Labs  04/21/16 0449 04/22/16 0423  WBC 12.1* 12.6*  RBC 4.14 3.95  HCT 35.6* 33.9*  PLT 285 265    Recent Labs  04/21/16 0449 04/22/16 0423  NA 138 137  K 4.0 4.1  CL 107 106  CO2 25 25  BUN 10 11  CREATININE 0.76 0.63  GLUCOSE 138* 126*  CALCIUM 8.3* 8.5*   No results for input(s): LABPT, INR in the last 72 hours.  EXAM: General - Patient is Alert, Appropriate and Oriented Extremity - Neurovascular intact Sensation intact distally Intact pulses distally Dorsiflexion/Plantar flexion intact Incision - clean, dry, no drainage Motor Function - intact, moving foot and toes well on exam.   Assessment/Plan: 2 Days Post-Op Procedure(s) (LRB): RIGHT TOTAL KNEE ARTHROPLASTY (Right) Procedure(s) (LRB): RIGHT TOTAL KNEE ARTHROPLASTY (Right) Past Medical History:  Diagnosis Date  . Allergy   . Arthritis    OA  . 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 GERD (gastroesophageal reflux disease)   . Swelling of lower extremity    RARELY SWELLS   Principal Problem:   OA  (osteoarthritis) of knee  Estimated body mass index is 39.54 kg/m as calculated from the following:   Height as of this encounter: 5\' 3"  (1.6 m).   Weight as of this encounter: 101.2 kg (223 lb 3.2 oz). Up with therapy Discharge home with home health Diet - Cardiac diet Follow up - in 2 weeks Activity - WBAT Disposition - Home Condition Upon Discharge - Good D/C Meds - See DC Summary DVT Prophylaxis - Xarelto  Arlee Muslim, PA-C Orthopaedic Surgery 04/22/2016, 7:48 AM

## 2016-04-23 DIAGNOSIS — Z96651 Presence of right artificial knee joint: Secondary | ICD-10-CM | POA: Diagnosis not present

## 2016-04-23 DIAGNOSIS — Z7982 Long term (current) use of aspirin: Secondary | ICD-10-CM | POA: Diagnosis not present

## 2016-04-23 DIAGNOSIS — Z471 Aftercare following joint replacement surgery: Secondary | ICD-10-CM | POA: Diagnosis not present

## 2016-04-23 NOTE — Telephone Encounter (Signed)
LM for patient to call back if any questions or concern. Patient also has MyChart in message advised that she can see the results there.  Thanks,  -Joseline

## 2016-04-29 DIAGNOSIS — Z471 Aftercare following joint replacement surgery: Secondary | ICD-10-CM | POA: Diagnosis not present

## 2016-04-29 DIAGNOSIS — Z7982 Long term (current) use of aspirin: Secondary | ICD-10-CM | POA: Diagnosis not present

## 2016-04-29 DIAGNOSIS — Z96651 Presence of right artificial knee joint: Secondary | ICD-10-CM | POA: Diagnosis not present

## 2016-05-01 DIAGNOSIS — Z96651 Presence of right artificial knee joint: Secondary | ICD-10-CM | POA: Diagnosis not present

## 2016-05-01 DIAGNOSIS — Z7982 Long term (current) use of aspirin: Secondary | ICD-10-CM | POA: Diagnosis not present

## 2016-05-01 DIAGNOSIS — Z471 Aftercare following joint replacement surgery: Secondary | ICD-10-CM | POA: Diagnosis not present

## 2016-05-05 DIAGNOSIS — Z471 Aftercare following joint replacement surgery: Secondary | ICD-10-CM | POA: Diagnosis not present

## 2016-05-05 DIAGNOSIS — Z96651 Presence of right artificial knee joint: Secondary | ICD-10-CM | POA: Diagnosis not present

## 2016-05-05 DIAGNOSIS — Z7982 Long term (current) use of aspirin: Secondary | ICD-10-CM | POA: Diagnosis not present

## 2016-05-07 DIAGNOSIS — Z96651 Presence of right artificial knee joint: Secondary | ICD-10-CM | POA: Diagnosis not present

## 2016-05-07 DIAGNOSIS — Z471 Aftercare following joint replacement surgery: Secondary | ICD-10-CM | POA: Diagnosis not present

## 2016-05-07 DIAGNOSIS — Z7982 Long term (current) use of aspirin: Secondary | ICD-10-CM | POA: Diagnosis not present

## 2016-05-12 DIAGNOSIS — Z7982 Long term (current) use of aspirin: Secondary | ICD-10-CM | POA: Diagnosis not present

## 2016-05-12 DIAGNOSIS — Z471 Aftercare following joint replacement surgery: Secondary | ICD-10-CM | POA: Diagnosis not present

## 2016-05-12 DIAGNOSIS — Z96651 Presence of right artificial knee joint: Secondary | ICD-10-CM | POA: Diagnosis not present

## 2016-05-15 DIAGNOSIS — Z471 Aftercare following joint replacement surgery: Secondary | ICD-10-CM | POA: Diagnosis not present

## 2016-05-15 DIAGNOSIS — Z96651 Presence of right artificial knee joint: Secondary | ICD-10-CM | POA: Diagnosis not present

## 2016-05-15 DIAGNOSIS — Z7982 Long term (current) use of aspirin: Secondary | ICD-10-CM | POA: Diagnosis not present

## 2016-05-21 DIAGNOSIS — Z471 Aftercare following joint replacement surgery: Secondary | ICD-10-CM | POA: Diagnosis not present

## 2016-05-21 DIAGNOSIS — Z7982 Long term (current) use of aspirin: Secondary | ICD-10-CM | POA: Diagnosis not present

## 2016-05-21 DIAGNOSIS — Z96651 Presence of right artificial knee joint: Secondary | ICD-10-CM | POA: Diagnosis not present

## 2016-05-25 DIAGNOSIS — Z96651 Presence of right artificial knee joint: Secondary | ICD-10-CM | POA: Diagnosis not present

## 2016-05-25 DIAGNOSIS — Z471 Aftercare following joint replacement surgery: Secondary | ICD-10-CM | POA: Diagnosis not present

## 2016-05-25 DIAGNOSIS — Z7982 Long term (current) use of aspirin: Secondary | ICD-10-CM | POA: Diagnosis not present

## 2016-05-26 DIAGNOSIS — Z96651 Presence of right artificial knee joint: Secondary | ICD-10-CM | POA: Diagnosis not present

## 2016-05-26 DIAGNOSIS — Z471 Aftercare following joint replacement surgery: Secondary | ICD-10-CM | POA: Diagnosis not present

## 2016-10-09 DIAGNOSIS — Z96651 Presence of right artificial knee joint: Secondary | ICD-10-CM | POA: Diagnosis not present

## 2016-10-09 DIAGNOSIS — Z471 Aftercare following joint replacement surgery: Secondary | ICD-10-CM | POA: Diagnosis not present

## 2016-10-09 DIAGNOSIS — M1711 Unilateral primary osteoarthritis, right knee: Secondary | ICD-10-CM | POA: Diagnosis not present

## 2016-10-13 DIAGNOSIS — H0013 Chalazion right eye, unspecified eyelid: Secondary | ICD-10-CM | POA: Diagnosis not present

## 2016-10-19 ENCOUNTER — Other Ambulatory Visit: Payer: Self-pay | Admitting: Family Medicine

## 2016-10-19 DIAGNOSIS — K219 Gastro-esophageal reflux disease without esophagitis: Secondary | ICD-10-CM

## 2016-10-19 NOTE — Telephone Encounter (Signed)
Pt is requesting refill on omeprazole (PRILOSEC) 20 MG capsule sent to [facility] order company

## 2016-10-20 MED ORDER — OMEPRAZOLE 20 MG PO CPDR: 20.0000 mg | DELAYED_RELEASE_CAPSULE | Freq: Two times a day (BID) | ORAL | 3 refills | Status: DC

## 2017-01-14 ENCOUNTER — Telehealth: Payer: Self-pay | Admitting: Family Medicine

## 2017-01-21 ENCOUNTER — Ambulatory Visit (INDEPENDENT_AMBULATORY_CARE_PROVIDER_SITE_OTHER): Payer: PPO

## 2017-01-21 VITALS — BP 132/74 | HR 72 | Temp 98.9°F | Ht 63.0 in | Wt 233.2 lb

## 2017-01-21 DIAGNOSIS — Z0000 Encounter for general adult medical examination without abnormal findings: Secondary | ICD-10-CM

## 2017-01-21 NOTE — Patient Instructions (Signed)
[name] , Thank you for taking time to come for your Medicare Wellness Visit. I appreciate your ongoing commitment to your health goals. Please review the following plan we discussed and let me know if I can assist you in the future.   Screening recommendations/referrals: Colonoscopy: up to date Mammogram: due 04/2017 Bone Density: up to date Recommended yearly ophthalmology/optometry visit for glaucoma screening and checkup Recommended yearly dental visit for hygiene and checkup  Vaccinations: Influenza vaccine: declined Pneumococcal vaccine: completed series Tdap vaccine: up to date Shingles vaccine: declined  Advanced directives: Please bring a copy of your POA (Power of Attorney) and/or Living Will to your next appointment.   Conditions/risks identified: Recommend increasing exercise to 2 days a week for 1 hour session.   Next appointment: 01/25/17 @ 9:00 AM   Preventive Care 72 Years and Older, Female Preventive care refers to lifestyle choices and visits with your health care provider that can promote health and wellness. What does preventive care include?  A yearly physical exam. This is also called an annual well check.  Dental exams once or twice a year.  Routine eye exams. Ask your health care provider how often you should have your eyes checked.  Personal lifestyle choices, including:  Daily care of your teeth and gums.  Regular physical activity.  Eating a healthy diet.  Avoiding tobacco and drug use.  Limiting alcohol use.  Practicing safe sex.  Taking low-dose aspirin every day.  Taking vitamin and mineral supplements as recommended by your health care provider. What happens during an annual well check? The services and screenings done by your health care provider during your annual well check will depend on your age, overall health, lifestyle risk factors, and family history of disease. Counseling  Your health care provider may ask you questions  about your:  Alcohol use.  Tobacco use.  Drug use.  Emotional well-being.  Home and relationship well-being.  Sexual activity.  Eating habits.  History of falls.  Memory and ability to understand (cognition).  Work and work Statistician.  Reproductive health. Screening  You may have the following tests or measurements:  Height, weight, and BMI.  Blood pressure.  Lipid and cholesterol levels. These may be checked every 5 years, or more frequently if you are over 72 years old.  Skin check.  Lung cancer screening. You may have this screening every year starting at age 72 if you have a 30-pack-year history of smoking and currently smoke or have quit within the past 15 years.  Fecal occult blood test (FOBT) of the stool. You may have this test every year starting at age 72.  Flexible sigmoidoscopy or colonoscopy. You may have a sigmoidoscopy every 5 years or a colonoscopy every 10 years starting at age 72.  Hepatitis C blood test.  Hepatitis B blood test.  Sexually transmitted disease (STD) testing.  Diabetes screening. This is done by checking your blood sugar (glucose) after you have not eaten for a while (fasting). You may have this done every 1-3 years.  Bone density scan. This is done to screen for osteoporosis. You may have this done starting at age 72.  Mammogram. This may be done every 1-2 years. Talk to your health care provider about how often you should have regular mammograms. Talk with your health care provider about your test results, treatment options, and if necessary, the need for more tests. Vaccines  Your health care provider may recommend certain vaccines, such as:  Influenza vaccine. This is  recommended every year.  Tetanus, diphtheria, and acellular pertussis (Tdap, Td) vaccine. You may need a Td booster every 10 years.  Zoster vaccine. You may need this after age 72.  Pneumococcal 13-valent conjugate (PCV13) vaccine. One dose is  recommended after age 72.  Pneumococcal polysaccharide (PPSV23) vaccine. One dose is recommended after age 72. Talk to your health care provider about which screenings and vaccines you need and how often you need them. This information is not intended to replace advice given to you by your health care provider. Make sure you discuss any questions you have with your health care provider. Document Released: 05/24/2015 Document Revised: 01/15/2016 Document Reviewed: 02/26/2015 Elsevier Interactive Patient Education  2017 [place] Prevention in the Home Falls can cause injuries. They can happen to people of all ages. There are many things you can do to make your home safe and to help prevent falls. What can I do on the outside of my home?  Regularly fix the edges of walkways and driveways and fix any cracks.  Remove anything that might make you trip as you walk through a door, such as a raised step or threshold.  Trim any bushes or trees on the path to your home.  Use bright outdoor lighting.  Clear any walking paths of anything that might make someone trip, such as rocks or tools.  Regularly check to see if handrails are loose or broken. Make sure that both sides of any steps have handrails.  Any raised decks and porches should have guardrails on the edges.  Have any leaves, snow, or ice cleared regularly.  Use sand or salt on walking paths during winter.  Clean up any spills in your garage right away. This includes oil or grease spills. What can I do in the bathroom?  Use night lights.  Install grab bars by the toilet and in the tub and shower. Do not use towel bars as grab bars.  Use non-skid mats or decals in the tub or shower.  If you need to sit down in the shower, use a plastic, non-slip stool.  Keep the floor dry. Clean up any water that spills on the floor as soon as it happens.  Remove soap buildup in the tub or shower regularly.  Attach bath mats  securely with double-sided non-slip rug tape.  Do not have throw rugs and other things on the floor that can make you trip. What can I do in the bedroom?  Use night lights.  Make sure that you have a light by your bed that is easy to reach.  Do not use any sheets or blankets that are too big for your bed. They should not hang down onto the floor.  Have a firm chair that has side arms. You can use this for support while you get dressed.  Do not have throw rugs and other things on the floor that can make you trip. What can I do in the kitchen?  Clean up any spills right away.  Avoid walking on wet floors.  Keep items that you use a lot in easy-to-reach places.  If you need to reach something above you, use a strong step stool that has a grab bar.  Keep electrical cords out of the way.  Do not use floor polish or wax that makes floors slippery. If you must use wax, use non-skid floor wax.  Do not have throw rugs and other things on the floor that can make you trip.  What can I do with my stairs?  Do not leave any items on the stairs.  Make sure that there are handrails on both sides of the stairs and use them. Fix handrails that are broken or loose. Make sure that handrails are as long as the stairways.  Check any carpeting to make sure that it is firmly attached to the stairs. Fix any carpet that is loose or worn.  Avoid having throw rugs at the top or bottom of the stairs. If you do have throw rugs, attach them to the floor with carpet tape.  Make sure that you have a light switch at the top of the stairs and the bottom of the stairs. If you do not have them, ask someone to add them for you. What else can I do to help prevent falls?  Wear shoes that:  Do not have high heels.  Have rubber bottoms.  Are comfortable and fit you well.  Are closed at the toe. Do not wear sandals.  If you use a stepladder:  Make sure that it is fully opened. Do not climb a closed  stepladder.  Make sure that both sides of the stepladder are locked into place.  Ask someone to hold it for you, if possible.  Clearly mark and make sure that you can see:  Any grab bars or handrails.  First and last steps.  Where the edge of each step is.  Use tools that help you move around (mobility aids) if they are needed. These include:  Canes.  Walkers.  Scooters.  Crutches.  Turn on the lights when you go into a dark area. Replace any light bulbs as soon as they burn out.  Set up your furniture so you have a clear path. Avoid moving your furniture around.  If any of your floors are uneven, fix them.  If there are any pets around you, be aware of where they are.  Review your medicines with your doctor. Some medicines can make you feel dizzy. This can increase your chance of falling. Ask your doctor what other things that you can do to help prevent falls. This information is not intended to replace advice given to you by your health care provider. Make sure you discuss any questions you have with your health care provider. Document Released: 02/21/2009 Document Revised: 10/03/2015 Document Reviewed: 06/01/2014 Elsevier Interactive Patient Education  2017 [employer].

## 2017-01-21 NOTE — Progress Notes (Signed)
Subjective:   Amanda Carpenter is a 71 y.o. female who presents for Medicare Annual (Subsequent) preventive examination.  Review of Systems:  N/A  Cardiac Risk Factors include: advanced age (>82men, >75 women);dyslipidemia;obesity (BMI >30kg/m2)     Objective:     Vitals: BP 132/74 (BP Location: Left Arm)   Pulse 72   Temp 98.9 F (37.2 C) (Oral)   Ht 5\' 3"  (1.6 m)   Wt 233 lb 3.2 oz (105.8 kg)   BMI 41.31 kg/m   Body mass index is 41.31 kg/m.   Tobacco History  Smoking Status  . Former Smoker  . Packs/day: 1.00  . Years: 10.00  . Types: Cigarettes  . Quit date: 05/11/1985  Smokeless Tobacco  . Never Used     Counseling given: Not Answered   Past Medical History:  Diagnosis Date  . Allergy   . Arthritis    OA  . 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 GERD (gastroesophageal reflux disease)   . Swelling of lower extremity    RARELY SWELLS   Past Surgical History:  Procedure Laterality Date  . CHOLECYSTECTOMY  1990   Dr. Bary Castilla  . COLONSCOPY AND ENDOSCOPY  2014  . KNEE ARTHROPLASTY Left 04/2014  . TOTAL KNEE ARTHROPLASTY Right 04/20/2016   Procedure: RIGHT TOTAL KNEE ARTHROPLASTY;  Surgeon: Gaynelle Arabian, MD;  Location: [facility];  Service: Orthopedics;  Laterality: Right;  . 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 Breast cancer Sister    History  Sexual Activity  . Sexual activity: Not on file    Outpatient Encounter Prescriptions as of 01/21/2017  Medication Sig  . omeprazole (PRILOSEC) 20 MG capsule Take 1 capsule (20 mg total) by mouth 2 (two) times daily before a meal.  . traMADol (ULTRAM) 50 MG tablet Take 1-2 tablets (50-100 mg total) by mouth every 6 (six) hours as needed (mild pain).  . Vitamin D, Ergocalciferol, 2000 units CAPS Take 2,000 Units by mouth daily.  . furosemide (LASIX) 20 MG tablet Take 20 mg by mouth daily as needed for fluid.  Marland Kitchen ketoconazole (NIZORAL) 2 %  cream Apply 1 application topically daily. To affected areas. (Patient not taking: Reported on 01/21/2017)  . rivaroxaban (XARELTO) 10 MG TABS tablet Take 1 tablet (10 mg total) by mouth daily with breakfast. Take Xarelto for two and a half more weeks, then discontinue Xarelto. Once the patient has completed the blood thinner regimen, then take a Baby 81 mg Aspirin daily for three more weeks. (Patient not taking: Reported on 01/21/2017)  . [DISCONTINUED] HYDROmorphone (DILAUDID) 2 MG tablet Take 1-2 tablets (2-4 mg total) by mouth every 3 (three) hours as needed for severe pain. (Patient not taking: Reported on 01/21/2017)  . [DISCONTINUED] methocarbamol (ROBAXIN) 500 MG tablet Take 1 tablet (500 mg total) by mouth every 6 (six) hours as needed for muscle spasms. (Patient not taking: Reported on 01/21/2017)   No facility-administered encounter medications on file as of 01/21/2017.     Activities of Daily Living In your present state of health, do you have any difficulty performing the following activities: 01/21/2017 04/20/2016  Hearing? N N  Comment - -  Vision? N N  Difficulty concentrating or making decisions? N N  Comment - -  Walking or climbing stairs? N Y  Dressing or bathing? N N  Doing errands, shopping? N [employer]  and eating ? N -  Using the Toilet? N -  In the past six months, have you accidently leaked urine? Y -  Comment occasionally when laughing or coughing -  Do you have problems with loss of bowel control? N -  Managing your Medications? N -  Managing your Finances? N -  Housekeeping or managing your Housekeeping? N -  Some recent data might be hidden    Patient Care Team: Jerrol Banana., MD as PCP - General (Family Medicine)    Assessment:     Exercise Activities and Dietary recommendations Current Exercise Habits: The patient does not participate in regular exercise at present, Exercise limited by: None identified  Goals    . Exercise 150 minutes  per week (moderate activity)    . Increase exercise          Recommend increasing exercise to 2 days a week for 1 hour session.       Fall Risk Fall Risk  01/21/2017 01/23/2016 01/09/2015  Falls in the past year? No No No   Depression Screen PHQ 2/9 Scores 01/21/2017 01/23/2016 01/09/2015  PHQ - 2 Score 0 0 0     Cognitive Function- Pt declined screening today.        Immunization History  Administered Date(s) Administered  . Pneumococcal Conjugate-13 01/09/2015  . Pneumococcal Polysaccharide-23 11/02/2012  . Td 05/20/2005  . Tdap 02/16/2011   Screening Tests Health Maintenance  Topic Date Due  . INFLUENZA VACCINE  01/09/2018 (Originally 12/09/2016)  . MAMMOGRAM  04/16/2018  . TETANUS/TDAP  02/15/2021  . COLONOSCOPY  11/30/2022  . DEXA SCAN  Completed  . Hepatitis C Screening  Completed  . PNA vac Low Risk Adult  Completed      Plan:  I have personally reviewed and addressed the Medicare Annual Wellness questionnaire and have noted the following in the patient's chart:  A. Medical and social history B. Use of alcohol, tobacco or illicit drugs  C. Current medications and supplements D. Functional ability and status E.  Nutritional status F.  Physical activity G. Advance directives H. List of other physicians I.  Hospitalizations, surgeries, and ER visits in previous 12 months J.  [place] such as hearing and vision if needed, cognitive and depression L. Referrals and appointments - none  In addition, I have reviewed and discussed with patient certain preventive protocols, quality metrics, and best practice recommendations. A written personalized care plan for preventive services as well as general preventive health recommendations were provided to patient.  See attached scanned questionnaire for additional information.   Signed,  Fabio Neighbors, LPN Nurse Health Advisor   MD Recommendations: None. Pt declined influenza vaccine today.

## 2017-01-22 ENCOUNTER — Ambulatory Visit: Payer: Self-pay

## 2017-01-25 ENCOUNTER — Encounter: Payer: Self-pay | Admitting: Family Medicine

## 2017-01-25 ENCOUNTER — Ambulatory Visit (INDEPENDENT_AMBULATORY_CARE_PROVIDER_SITE_OTHER): Payer: PPO | Admitting: Family Medicine

## 2017-01-25 VITALS — BP 112/68 | HR 72 | Temp 97.7°F | Resp 16 | Ht 63.0 in | Wt 217.0 lb

## 2017-01-25 DIAGNOSIS — R600 Localized edema: Secondary | ICD-10-CM

## 2017-01-25 DIAGNOSIS — Z0000 Encounter for general adult medical examination without abnormal findings: Secondary | ICD-10-CM

## 2017-01-25 DIAGNOSIS — E669 Obesity, unspecified: Secondary | ICD-10-CM

## 2017-01-25 DIAGNOSIS — E7800 Pure hypercholesterolemia, unspecified: Secondary | ICD-10-CM | POA: Diagnosis not present

## 2017-01-25 DIAGNOSIS — R7303 Prediabetes: Secondary | ICD-10-CM

## 2017-01-25 DIAGNOSIS — M8589 Other specified disorders of bone density and structure, multiple sites: Secondary | ICD-10-CM

## 2017-01-25 DIAGNOSIS — Z6838 Body mass index (BMI) 38.0-38.9, adult: Secondary | ICD-10-CM

## 2017-01-25 DIAGNOSIS — K219 Gastro-esophageal reflux disease without esophagitis: Secondary | ICD-10-CM

## 2017-01-25 NOTE — Assessment & Plan Note (Signed)
Recheck lipid panel 

## 2017-01-25 NOTE — Assessment & Plan Note (Signed)
Recheck A1c Discussed diet and exercise 

## 2017-01-25 NOTE — Assessment & Plan Note (Signed)
Currently well controlled Concerned about long-term effects of PPI Previously well controlled with H2 blocker Advised d/c PPI and trial of H2 blocker Return precautions discussed

## 2017-01-25 NOTE — Assessment & Plan Note (Signed)
Taking Ca (Tums) and Vit D supplement Recheck DEXA in 04/2018

## 2017-01-25 NOTE — Patient Instructions (Addendum)
Try ranitidine (Zantac) 160m twice daily instead of Omeprazole    Preventive Care 65 Years and Older, Female Preventive care refers to lifestyle choices and visits with your health care provider that can promote health and wellness. What does preventive care include?  A yearly physical exam. This is also called an annual well check.  Dental exams once or twice a year.  Routine eye exams. Ask your health care provider how often you should have your eyes checked.  Personal lifestyle choices, including: ? Daily care of your teeth and gums. ? Regular physical activity. ? Eating a healthy diet. ? Avoiding tobacco and drug use. ? Limiting alcohol use. ? Practicing safe sex. ? Taking low-dose aspirin every day. ? Taking vitamin and mineral supplements as recommended by your health care provider. What happens during an annual well check? The services and screenings done by your health care provider during your annual well check will depend on your age, overall health, lifestyle risk factors, and family history of disease. Counseling Your health care provider may ask you questions about your:  Alcohol use.  Tobacco use.  Drug use.  Emotional well-being.  Home and relationship well-being.  Sexual activity.  Eating habits.  History of falls.  Memory and ability to understand (cognition).  Work and work eStatistician  Reproductive health.  Screening You may have the following tests or measurements:  Height, weight, and BMI.  Blood pressure.  Lipid and cholesterol levels. These may be checked every 5 years, or more frequently if you are over 72years old.  Skin check.  Lung cancer screening. You may have this screening every year starting at age 72if you have a 30-pack-year history of smoking and currently smoke or have quit within the past 15 years.  Fecal occult blood test (FOBT) of the stool. You may have this test every year starting at age 72  Flexible  sigmoidoscopy or colonoscopy. You may have a sigmoidoscopy every 5 years or a colonoscopy every 10 years starting at age 72  Hepatitis C blood test.  Hepatitis B blood test.  Sexually transmitted disease (STD) testing.  Diabetes screening. This is done by checking your blood sugar (glucose) after you have not eaten for a while (fasting). You may have this done every 1-3 years.  Bone density scan. This is done to screen for osteoporosis. You may have this done starting at age 72  Mammogram. This may be done every 1-2 years. Talk to your health care provider about how often you should have regular mammograms.  Talk with your health care provider about your test results, treatment options, and if necessary, the need for more tests. Vaccines Your health care provider may recommend certain vaccines, such as:  Influenza vaccine. This is recommended every year.  Tetanus, diphtheria, and acellular pertussis (Tdap, Td) vaccine. You may need a Td booster every 10 years.  Varicella vaccine. You may need this if you have not been vaccinated.  Zoster vaccine. You may need this after age 72  Measles, mumps, and rubella (MMR) vaccine. You may need at least one dose of MMR if you were born in 1957 or later. You may also need a second dose.  Pneumococcal 13-valent conjugate (PCV13) vaccine. One dose is recommended after age 72  Pneumococcal polysaccharide (PPSV23) vaccine. One dose is recommended after age 72  Meningococcal vaccine. You may need this if you have certain conditions.  Hepatitis A vaccine. You may need this if you have certain conditions or if you  travel or work in places where you may be exposed to hepatitis A.  Hepatitis B vaccine. You may need this if you have certain conditions or if you travel or work in places where you may be exposed to hepatitis B.  Haemophilus influenzae type b (Hib) vaccine. You may need this if you have certain conditions.  Talk to your health  care provider about which screenings and vaccines you need and how often you need them. This information is not intended to replace advice given to you by your health care provider. Make sure you discuss any questions you have with your health care provider. Document Released: 05/24/2015 Document Revised: 01/15/2016 Document Reviewed: 02/26/2015 Elsevier Interactive Patient Education  2017 Elsevier Inc.  

## 2017-01-25 NOTE — Progress Notes (Signed)
Patient: Amanda Carpenter, Female    DOB: 05-08-1946, 71 y.o.   MRN: [id number] Visit Date: 01/25/2017  Today's Provider: Lavon Paganini, MD   Chief Complaint  Patient presents with  . Annual Exam   Subjective:    Annual physical exam Amanda Carpenter is a 71 y.o. female who presents today for health maintenance and complete physical. She feels fairly well. Pt is c/o right leg swelling, worse at night. S/P right TKR in December, 2017. She denies claudication, pain, SOB. She does not take Lasix because she was informed that the medication can cause kidney damage. This was prescribed for prn use for LE edema - no renal/cardiac problems.  Does not wear compression socks.  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 She was treated with the cream. 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 She reports she is exercising. She has a Physiological scientist, and walks on treadmill daily. She reports she is sleeping well.  Last mammogram- 04/20/2016- BI-RADS 1 Last colonoscopy- 11/29/2012- non-bleeding internal hemorrhoids, colitis and chronic gastritis. Repeat 10 years. Last BMD- 04/16/2016- osteopenia She had her annual wellness visit on 01/21/2017. -----------------------------------------------------------------   Review of Systems  Constitutional: Negative.   HENT: Positive for tinnitus. Negative for congestion, dental problem, drooling, ear discharge, ear pain, facial swelling, hearing loss, mouth sores, nosebleeds, postnasal drip, rhinorrhea, sinus pain, sinus pressure, sneezing, sore throat, trouble swallowing and voice change.   Eyes: Negative.   Respiratory: Negative.   Cardiovascular: Positive for leg swelling (right leg. S/P TKR December 2017). Negative for chest pain and palpitations.  Gastrointestinal: Negative.   Endocrine: Negative.   Genitourinary:  Negative.   Musculoskeletal: Negative.   Skin: Negative.   Allergic/Immunologic: Negative.   Neurological: Negative.   Hematological: Negative.   Psychiatric/Behavioral: Negative.     Social History      She  reports that she quit smoking about 31 years ago. Her smoking use included Cigarettes. She has a 10.00 pack-year smoking history. She has never used smokeless tobacco. She reports that she drinks alcohol. She reports that she does not use drugs.       Social History   Social History  . Marital status: Married    Spouse name: Deidre Ala  . Number of children: 2  . Years of education: bachelor's   Occupational History  .  [employer]   Social History Main Topics  . Smoking status: Former Smoker    Packs/day: 1.00    Years: 10.00    Types: Cigarettes    Quit date: 05/11/1985  . Smokeless tobacco: Never Used  . Alcohol use Yes     Comment: SOCIAL 3 X YEAR  . Drug use: No  . Sexual activity: No   Other Topics Concern  . None   Social History Narrative  . None    Past Medical History:  Diagnosis Date  . Allergy   . Arthritis    OA  . 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 GERD (gastroesophageal reflux disease)   . Swelling of lower extremity    RARELY SWELLS     Patient Active Problem List   Diagnosis Date Noted  . Cellulitis of leg, left 11/14/2015  . Allergic rhinitis 10/02/2014  . Arthritis 10/02/2014  . 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 Accumulation of  fluid in tissues 10/02/2014  . Fatigue 10/02/2014  . Acid reflux 10/02/2014  . Bleeding gastrointestinal 10/02/2014  . 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 Hypercholesteremia 10/02/2014  . Adult hypothyroidism 10/02/2014  . Gonalgia 10/02/2014  . Adiposity 10/02/2014  . Osteopenia 10/02/2014  . Breath shortness 10/02/2014  . Skin thinning 10/02/2014  . 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 Avitaminosis D 10/02/2014  . 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 OA  (osteoarthritis) of knee 04/02/2014  . Borderline diabetes 08/14/2009    Past Surgical History:  Procedure Laterality Date  . CHOLECYSTECTOMY  1990   Dr. Bary Castilla  . COLONSCOPY AND ENDOSCOPY  2014  . KNEE ARTHROPLASTY Left 04/2014  . TOTAL KNEE ARTHROPLASTY Right 04/20/2016   Procedure: RIGHT TOTAL KNEE ARTHROPLASTY;  Surgeon: Gaynelle Arabian, MD;  Location: [facility];  Service: Orthopedics;  Laterality: Right;  . 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 Mother Alive  . Father Deceased at age 35       MI; CAD  . Sister Deceased at age 21       Breast Cancer (possible reaction to chemotherapy)        Her family history includes Breast cancer in her sister; Healthy in her mother; Heart attack (age of onset: 85) in her father; Hyperlipidemia in her mother; Hypertension in her mother.     Allergies  Allergen Reactions  . Motrin [Ibuprofen]     COLLAGENOUS COALITIS     Current Outpatient Prescriptions:  .  omeprazole (PRILOSEC) 20 MG capsule, Take 1 capsule (20 mg total) by mouth 2 (two) times daily before a meal., Disp: 180 capsule, Rfl: 3 .  Vitamin D, Ergocalciferol, 2000 units CAPS, Take 2,000 Units by mouth daily., Disp: , Rfl:  .  furosemide (LASIX) 20 MG tablet, Take 20 mg by mouth daily as needed for fluid., Disp: , Rfl:  .  ketoconazole (NIZORAL) 2 % cream, Apply 1 application topically daily. To affected areas. (Patient not taking: Reported on 01/21/2017), Disp: 15 g, Rfl: 0 .  traMADol (ULTRAM) 50 MG tablet, Take 1-2 tablets (50-100 mg total) by mouth every 6 (six) hours as needed (mild pain). (Patient not taking: Reported on 01/25/2017), Disp: 80 tablet, Rfl: 1   Patient Care Team: Jerrol Banana., MD as PCP - General (Family Medicine)      Objective:   Vitals: BP 112/68 (BP Location: Left Arm, Patient Position: Sitting, Cuff Size: Large)   Pulse 72   Temp 97.7 F (36.5 C) (Oral)   Resp 16   Ht 5'  3" (1.6 m)   Wt 217 lb (98.4 kg)   BMI 38.44 kg/m    Vitals:   01/25/17 0909  BP: 112/68  Pulse: 72  Resp: 16  Temp: 97.7 F (36.5 C)  TempSrc: Oral  Weight: 217 lb (98.4 kg)  Height: 5\' 3"  (1.6 m)     Physical Exam  Constitutional: She is oriented to person, place, and time. She appears well-developed and well-nourished. No distress.  HENT:  Head: Normocephalic and atraumatic.  Eyes: Conjunctivae are normal. No scleral icterus.  Neck: Neck supple. No thyromegaly present.  Cardiovascular: Normal rate, regular rhythm, normal heart sounds and intact distal pulses.   No murmur heard. Pulmonary/Chest: Effort normal and breath sounds normal. No respiratory distress. She has no wheezes. She has no rales.  Breasts: breasts  appear normal, no suspicious masses, no skin or nipple changes or axillary nodes.  Abdominal: Soft. Bowel sounds are normal. She exhibits no distension. There is no tenderness. There is no rebound and no guarding.  Musculoskeletal: She exhibits edema (trace). She exhibits no deformity.  Lymphadenopathy:    She has no cervical adenopathy.  Neurological: She is alert and oriented to person, place, and time.  Skin: Skin is warm and dry. No rash noted.  Psychiatric: She has a normal mood and affect. Her behavior is normal.  Vitals reviewed.    Depression Screen PHQ 2/9 Scores 01/21/2017 01/23/2016 01/09/2015  PHQ - 2 Score 0 0 0   Audit-C Alcohol Use Screening  Question Answer Points  How often do you have alcoholic drink? 2-3 times yearly 1  On days you do drink alcohol, how many drinks do you typically consume? 1 0  How oftey will you drink 6 or more in a total? never 0  Total Score:  1   A score of 3 or more in women, and 4 or more in men indicates increased risk for alcohol abuse, EXCEPT if all of the points are from question 1.     Assessment & Plan:     Routine Health Maintenance and Physical Exam  Exercise Activities and Dietary  recommendations Goals    . Exercise 150 minutes per week (moderate activity)    . Increase exercise          Recommend increasing exercise to 2 days a week for 1 hour session.        Immunization History  Administered Date(s) Administered  . Pneumococcal Conjugate-13 01/09/2015  . Pneumococcal Polysaccharide-23 11/02/2012  . Td 05/20/2005  . Tdap 02/16/2011    Health Maintenance  Topic Date Due  . INFLUENZA VACCINE  01/09/2018 (Originally 12/09/2016)  . MAMMOGRAM  04/16/2018  . TETANUS/TDAP  02/15/2021  . COLONOSCOPY  11/30/2022  . DEXA SCAN  Completed  . Hepatitis C Screening  Completed  . PNA vac Low Risk Adult  Completed     Discussed health benefits of physical activity, and encouraged her to engage in regular exercise appropriate for her age and condition.   UTD on colon cancer screening Wishes to have q2 year breast cancer screening   -------------------------------------------------------------------- Problem List Items Addressed This Visit      Digestive   Acid reflux    Currently well controlled Concerned about long-term effects of PPI Previously well controlled with H2 blocker Advised d/c PPI and trial of H2 blocker Return precautions discussed        Musculoskeletal and Integument   Osteopenia    Taking Ca (Tums) and Vit D supplement Recheck DEXA in 04/2018        Other   Hypercholesteremia    Recheck lipid panel      Relevant Orders   COMPLETE METABOLIC PANEL WITH GFR   Lipid panel   Adiposity    Discussed diet and exercise      Relevant Orders   COMPLETE METABOLIC PANEL WITH GFR   Borderline diabetes    Recheck A1c Discussed diet and exercise      Relevant Orders   Hemoglobin A1c   Lower extremity edema    Benign, dependent edema Advised compression socks - patient declines Advised moving around while standing at work [place] to not use LAsix      Relevant Orders   CBC w/Diff/Platelet    Other Visit Diagnoses    Healthcare  maintenance    -  Primary   Relevant Orders   COMPLETE METABOLIC PANEL WITH GFR   Lipid panel   CBC w/Diff/Platelet   Hemoglobin A1c      Return in about 1 year (around 01/25/2018) for CPE.   The entirety of the information documented in the History of Present Illness, Review of Systems and Physical Exam were personally obtained by me. Portions of this information were initially documented by Raquel Sarna Ratchford, CMA and reviewed by me for thoroughness and accuracy.     Lavon Paganini, MD  [facility]

## 2017-01-25 NOTE — Assessment & Plan Note (Signed)
Discussed diet and exercise 

## 2017-01-25 NOTE — Assessment & Plan Note (Signed)
Benign, dependent edema Advised compression socks - patient declines Advised moving around while standing at work Ok to not use LAsix

## 2017-01-26 ENCOUNTER — Telehealth: Payer: Self-pay

## 2017-01-26 LAB — CBC WITH DIFFERENTIAL/PLATELET
BASOS ABS: 58 {cells}/uL (ref 0–200)
Basophils Relative: 0.8 %
EOS PCT: 2.2 %
Eosinophils Absolute: 161 cells/uL (ref 15–500)
HEMATOCRIT: 39.4 % (ref 35.0–45.0)
HEMOGLOBIN: 12.6 g/dL (ref 11.7–15.5)
LYMPHS ABS: 1986 {cells}/uL (ref 850–3900)
MCH: 26.5 pg — ABNORMAL LOW (ref 27.0–33.0)
MCHC: 32 g/dL (ref 32.0–36.0)
MCV: 82.8 fL (ref 80.0–100.0)
MPV: 9.6 fL (ref 7.5–12.5)
Monocytes Relative: 8.9 %
NEUTROS ABS: 4446 {cells}/uL (ref 1500–7800)
NEUTROS PCT: 60.9 %
Platelets: 373 10*3/uL (ref 140–400)
RBC: 4.76 10*6/uL (ref 3.80–5.10)
RDW: 13 % (ref 11.0–15.0)
Total Lymphocyte: 27.2 %
WBC: 7.3 10*3/uL (ref 3.8–10.8)
WBCMIX: 650 {cells}/uL (ref 200–950)

## 2017-01-26 LAB — HEMOGLOBIN A1C
HEMOGLOBIN A1C: 5.6 %{Hb} (ref <5.7)
MEAN PLASMA GLUCOSE: 114 (calc)
eAG (mmol/L): 6.3 (calc)

## 2017-01-26 LAB — COMPLETE METABOLIC PANEL WITH GFR
AG RATIO: 1.5 (calc) (ref 1.0–2.5)
ALT: 10 U/L (ref 6–29)
AST: 13 U/L (ref 10–35)
Albumin: 3.8 g/dL (ref 3.6–5.1)
Alkaline phosphatase (APISO): 116 U/L (ref 33–130)
BILIRUBIN TOTAL: 0.3 mg/dL (ref 0.2–1.2)
BUN: 14 mg/dL (ref 7–25)
CALCIUM: 9.1 mg/dL (ref 8.6–10.4)
CO2: 28 mmol/L (ref 20–32)
Chloride: 104 mmol/L (ref 98–110)
Creat: 0.76 mg/dL (ref 0.60–0.93)
GFR, Est African American: 91 mL/min/{1.73_m2} (ref >=60)
GFR, Est Non African American: 79 mL/min/{1.73_m2} (ref >=60)
GLOBULIN: 2.6 g/dL (ref 1.9–3.7)
Glucose, Bld: 101 mg/dL — ABNORMAL HIGH (ref 65–99)
POTASSIUM: 4.6 mmol/L (ref 3.5–5.3)
SODIUM: 138 mmol/L (ref 135–146)
Total Protein: 6.4 g/dL (ref 6.1–8.1)

## 2017-01-26 LAB — LIPID PANEL
CHOLESTEROL: 189 mg/dL (ref <200)
HDL: 53 mg/dL (ref >50)
LDL Cholesterol (Calc): 118 mg/dL (calc) — ABNORMAL HIGH
Non-HDL Cholesterol (Calc): 136 mg/dL (calc) — ABNORMAL HIGH (ref <130)
TRIGLYCERIDES: 82 mg/dL (ref <150)
Total CHOL/HDL Ratio: 3.6 (calc) (ref <5.0)

## 2017-01-26 NOTE — Telephone Encounter (Signed)
-----   Message from Virginia Crews, MD sent at 01/26/2017 11:12 AM EDT ----- Normal Blood counts, kidney function, liver function, electrolytes. A1c (diabetes measure) is better at 5.6, which is now in normal range.  Cholesterol is slightly high.  10 year risk of heart disease/stroke is 8.1%.  At this level, a statin would be recommended.  I typically would start a patient on Lipitor 40mg  daily to prevent heart disease and stroke.  Would patient be interested in taking a statin medication for cholesterol?  Virginia Crews, MD, MPH [facility] 01/26/2017 11:12 AM

## 2017-01-26 NOTE — Telephone Encounter (Signed)
Pt advised. States she would rather work on changing her diet before starting medication. Educated pt about low fat diet, and advised her we will continue to monitor.

## 2017-01-28 ENCOUNTER — Encounter: Payer: Self-pay | Admitting: Family Medicine

## 2017-02-10 NOTE — Telephone Encounter (Signed)
Visit completed.

## 2017-04-22 ENCOUNTER — Other Ambulatory Visit: Payer: Self-pay | Admitting: Emergency Medicine

## 2017-04-22 DIAGNOSIS — M1990 Unspecified osteoarthritis, unspecified site: Secondary | ICD-10-CM

## 2017-04-22 MED ORDER — DICLOFENAC SODIUM 1 % TD GEL: 2.0000 g | Freq: Two times a day (BID) | TRANSDERMAL | 0 refills | Status: DC

## 2017-05-19 ENCOUNTER — Ambulatory Visit (INDEPENDENT_AMBULATORY_CARE_PROVIDER_SITE_OTHER): Payer: PPO | Admitting: Family Medicine

## 2017-05-19 ENCOUNTER — Encounter: Payer: Self-pay | Admitting: Family Medicine

## 2017-05-19 VITALS — BP 122/60 | HR 68 | Temp 97.7°F | Resp 18 | Wt 227.0 lb

## 2017-05-19 DIAGNOSIS — J42 Unspecified chronic bronchitis: Secondary | ICD-10-CM

## 2017-05-19 DIAGNOSIS — R059 Cough, unspecified: Secondary | ICD-10-CM

## 2017-05-19 DIAGNOSIS — R05 Cough: Secondary | ICD-10-CM

## 2017-05-19 MED ORDER — HYDROCODONE-HOMATROPINE 5-1.5 MG/5ML PO SYRP: 5.0000 mL | ORAL_SOLUTION | Freq: Three times a day (TID) | ORAL | 0 refills | Status: DC | PRN

## 2017-05-19 MED ORDER — DOXYCYCLINE HYCLATE 100 MG PO TABS: 100.0000 mg | ORAL_TABLET | Freq: Two times a day (BID) | ORAL | 0 refills | Status: DC

## 2017-05-19 NOTE — Progress Notes (Signed)
Patient: Amanda Carpenter Female    DOB: Feb 04, 1946   72 y.o.   MRN: [id number] Visit Date: 05/19/2017  Today's Provider: Wilhemena Durie, MD   Chief Complaint  Patient presents with  . Cough   Subjective:    HPI Pt is here for cough and congestion. No fevers, chills, shortness of breath, chest pain or body aches. She reports that she started feeling bad about a week ago. She has sinus congestion, pain and pressure, cough with green sputum, sneezing, sore throat, and ear fullness. She reports that she does not feel bad, just does not feel " up to par". She has been taking some cough medication that she got a while back (hycodan). She would like a refill on this, she reports that it helps because ht cough is worse at night.      Allergies  Allergen Reactions  . Motrin [Ibuprofen]     COLLAGENOUS COALITIS     Current Outpatient Medications:  .  diclofenac sodium (VOLTAREN) 1 % GEL, Apply 2 g topically 2 (two) times daily., Disp: 100 g, Rfl: 0 .  omeprazole (PRILOSEC) 20 MG capsule, Take 1 capsule (20 mg total) by mouth 2 (two) times daily before a meal., Disp: 180 capsule, Rfl: 3 .  ranitidine (ZANTAC) 150 MG capsule, Take 150 mg by mouth 2 (two) times daily., Disp: , Rfl:  .  furosemide (LASIX) 20 MG tablet, Take 20 mg by mouth daily as needed for fluid., Disp: , Rfl:  .  ketoconazole (NIZORAL) 2 % cream, Apply 1 application topically daily. To affected areas. (Patient not taking: Reported on 01/21/2017), Disp: 15 g, Rfl: 0 .  traMADol (ULTRAM) 50 MG tablet, Take 1-2 tablets (50-100 mg total) by mouth every 6 (six) hours as needed (mild pain). (Patient not taking: Reported on 01/25/2017), Disp: 80 tablet, Rfl: 1 .  Vitamin D, Ergocalciferol, 2000 units CAPS, Take 2,000 Units by mouth daily., Disp: , Rfl:   Review of Systems  Constitutional: Positive for fatigue.  HENT: Positive for congestion, rhinorrhea, sinus pressure, sinus pain and sore throat.   Eyes: Negative.     Respiratory: Positive for cough.   Cardiovascular: Negative.   Gastrointestinal: Negative.   Endocrine: Negative.   Genitourinary: Negative.   Musculoskeletal: Negative.   Skin: Negative.   Allergic/Immunologic: Negative.   Neurological: Negative.   Hematological: Negative.   Psychiatric/Behavioral: Negative.     Social History   Tobacco Use  . Smoking status: Former Smoker    Packs/day: 1.00    Years: 10.00    Pack years: 10.00    Types: Cigarettes    Last attempt to quit: 05/11/1985    Years since quitting: 32.0  . Smokeless tobacco: Never Used  Substance Use Topics  . Alcohol use: Yes    Comment: SOCIAL 3 X YEAR   Objective:   BP 122/60 (BP Location: Left Arm, Patient Position: Sitting, Cuff Size: Large)   Pulse 68   Temp 97.7 F (36.5 C) (Oral)   Resp 18   Wt 227 lb (103 kg)   SpO2 98%   BMI 40.21 kg/m  Vitals:   05/19/17 0933  BP: 122/60  Pulse: 68  Resp: 18  Temp: 97.7 F (36.5 C)  TempSrc: Oral  SpO2: 98%  Weight: 227 lb (103 kg)     Physical Exam  Constitutional: She is oriented to person, place, and time. She appears well-developed and well-nourished.  HENT:  Head: Normocephalic and atraumatic.  Eyes:  Conjunctivae are normal. No scleral icterus.  Neck: No thyromegaly present.  Cardiovascular: Normal rate, regular rhythm and normal heart sounds.  Pulmonary/Chest: Effort normal and breath sounds normal.  Abdominal: Soft.  Neurological: She is alert and oriented to person, place, and time.  Skin: Skin is warm and dry.  Psychiatric: She has a normal mood and affect. Her behavior is normal. Judgment and thought content normal.        Assessment & Plan:     1. Cough  - HYDROcodone-homatropine (HYCODAN) 5-1.5 MG/5ML syrup; Take 5 mLs by mouth every 8 (eight) hours as needed for cough.  Dispense: 120 mL; Refill: 0  2. Chronic bronchitis, unspecified chronic bronchitis type (HCC)  - doxycycline (VIBRA-TABS) 100 MG tablet; Take 1 tablet (100  mg total) by mouth 2 (two) times daily.  Dispense: 20 tablet; Refill: 0  I have done the exam and reviewed the chart and it is accurate to the best of my knowledge. Development worker, community has been used and  any errors in dictation or transcription are unintentional. Miguel Aschoff M.D. [place], MD  [facility]

## 2017-06-01 DIAGNOSIS — M25561 Pain in right knee: Secondary | ICD-10-CM | POA: Diagnosis not present

## 2017-06-01 DIAGNOSIS — Z96651 Presence of right artificial knee joint: Secondary | ICD-10-CM | POA: Diagnosis not present

## 2017-06-01 DIAGNOSIS — M25861 Other specified joint disorders, right knee: Secondary | ICD-10-CM | POA: Diagnosis not present

## 2017-06-01 DIAGNOSIS — M25869 Other specified joint disorders, unspecified knee: Secondary | ICD-10-CM | POA: Diagnosis not present

## 2017-07-06 DIAGNOSIS — M6751 Plica syndrome, right knee: Secondary | ICD-10-CM | POA: Diagnosis not present

## 2017-07-06 DIAGNOSIS — Z9889 Other specified postprocedural states: Secondary | ICD-10-CM | POA: Diagnosis not present

## 2017-07-06 DIAGNOSIS — M65861 Other synovitis and tenosynovitis, right lower leg: Secondary | ICD-10-CM | POA: Diagnosis not present

## 2017-08-12 DIAGNOSIS — H0016 Chalazion left eye, unspecified eyelid: Secondary | ICD-10-CM | POA: Diagnosis not present

## 2017-10-06 DIAGNOSIS — H0014 Chalazion left upper eyelid: Secondary | ICD-10-CM | POA: Diagnosis not present

## 2018-01-31 ENCOUNTER — Ambulatory Visit (INDEPENDENT_AMBULATORY_CARE_PROVIDER_SITE_OTHER): Payer: PPO | Admitting: Family Medicine

## 2018-01-31 ENCOUNTER — Encounter: Payer: Self-pay | Admitting: Family Medicine

## 2018-01-31 VITALS — BP 142/60 | HR 66 | Temp 98.6°F | Ht 62.0 in | Wt 230.4 lb

## 2018-01-31 DIAGNOSIS — M85852 Other specified disorders of bone density and structure, left thigh: Secondary | ICD-10-CM | POA: Diagnosis not present

## 2018-01-31 DIAGNOSIS — Z1239 Encounter for other screening for malignant neoplasm of breast: Secondary | ICD-10-CM

## 2018-01-31 DIAGNOSIS — R600 Localized edema: Secondary | ICD-10-CM | POA: Diagnosis not present

## 2018-01-31 DIAGNOSIS — K219 Gastro-esophageal reflux disease without esophagitis: Secondary | ICD-10-CM | POA: Diagnosis not present

## 2018-01-31 DIAGNOSIS — L301 Dyshidrosis [pompholyx]: Secondary | ICD-10-CM | POA: Diagnosis not present

## 2018-01-31 DIAGNOSIS — E7800 Pure hypercholesterolemia, unspecified: Secondary | ICD-10-CM

## 2018-01-31 DIAGNOSIS — Z0000 Encounter for general adult medical examination without abnormal findings: Secondary | ICD-10-CM

## 2018-01-31 DIAGNOSIS — Z1231 Encounter for screening mammogram for malignant neoplasm of breast: Secondary | ICD-10-CM

## 2018-01-31 DIAGNOSIS — R7303 Prediabetes: Secondary | ICD-10-CM | POA: Diagnosis not present

## 2018-01-31 DIAGNOSIS — M85851 Other specified disorders of bone density and structure, right thigh: Secondary | ICD-10-CM

## 2018-01-31 MED ORDER — TRIAMCINOLONE ACETONIDE 0.5 % EX OINT: 1.0000 "application " | TOPICAL_OINTMENT | Freq: Two times a day (BID) | CUTANEOUS | 2 refills | Status: DC

## 2018-01-31 MED ORDER — OMEPRAZOLE 20 MG PO CPDR: 20.0000 mg | DELAYED_RELEASE_CAPSULE | Freq: Every day | ORAL | 3 refills | Status: DC

## 2018-01-31 NOTE — Assessment & Plan Note (Signed)
Recheck A1c Discussed diet and exercise 

## 2018-01-31 NOTE — Assessment & Plan Note (Addendum)
Discussed diet and exercise 

## 2018-01-31 NOTE — Patient Instructions (Signed)
Preventive Care 65 Years and Older, Female Preventive care refers to lifestyle choices and visits with your health care provider that can promote health and wellness. What does preventive care include?  A yearly physical exam. This is also called an annual well check.  Dental exams once or twice a year.  Routine eye exams. Ask your health care provider how often you should have your eyes checked.  Personal lifestyle choices, including: ? Daily care of your teeth and gums. ? Regular physical activity. ? Eating a healthy diet. ? Avoiding tobacco and drug use. ? Limiting alcohol use. ? Practicing safe sex. ? Taking low-dose aspirin every day. ? Taking vitamin and mineral supplements as recommended by your health care provider. What happens during an annual well check? The services and screenings done by your health care provider during your annual well check will depend on your age, overall health, lifestyle risk factors, and family history of disease. Counseling Your health care provider may ask you questions about your:  Alcohol use.  Tobacco use.  Drug use.  Emotional well-being.  Home and relationship well-being.  Sexual activity.  Eating habits.  History of falls.  Memory and ability to understand (cognition).  Work and work environment.  Reproductive health.  Screening You may have the following tests or measurements:  Height, weight, and BMI.  Blood pressure.  Lipid and cholesterol levels. These may be checked every 5 years, or more frequently if you are over 50 years old.  Skin check.  Lung cancer screening. You may have this screening every year starting at age 55 if you have a 30-pack-year history of smoking and currently smoke or have quit within the past 15 years.  Fecal occult blood test (FOBT) of the stool. You may have this test every year starting at age 50.  Flexible sigmoidoscopy or colonoscopy. You may have a sigmoidoscopy every 5 years or  a colonoscopy every 10 years starting at age 50.  Hepatitis C blood test.  Hepatitis B blood test.  Sexually transmitted disease (STD) testing.  Diabetes screening. This is done by checking your blood sugar (glucose) after you have not eaten for a while (fasting). You may have this done every 1-3 years.  Bone density scan. This is done to screen for osteoporosis. You may have this done starting at age 73.  Mammogram. This may be done every 1-2 years. Talk to your health care provider about how often you should have regular mammograms.  Talk with your health care provider about your test results, treatment options, and if necessary, the need for more tests. Vaccines Your health care provider may recommend certain vaccines, such as:  Influenza vaccine. This is recommended every year.  Tetanus, diphtheria, and acellular pertussis (Tdap, Td) vaccine. You may need a Td booster every 10 years.  Varicella vaccine. You may need this if you have not been vaccinated.  Zoster vaccine. You may need this after age 60.  Measles, mumps, and rubella (MMR) vaccine. You may need at least one dose of MMR if you were born in 1957 or later. You may also need a second dose.  Pneumococcal 13-valent conjugate (PCV13) vaccine. One dose is recommended after age 73.  Pneumococcal polysaccharide (PPSV23) vaccine. One dose is recommended after age 73.  Meningococcal vaccine. You may need this if you have certain conditions.  Hepatitis A vaccine. You may need this if you have certain conditions or if you travel or work in places where you may be exposed to hepatitis   A.  Hepatitis B vaccine. You may need this if you have certain conditions or if you travel or work in places where you may be exposed to hepatitis B.  Haemophilus influenzae type b (Hib) vaccine. You may need this if you have certain conditions.  Talk to your health care provider about which screenings and vaccines you need and how often you  need them. This information is not intended to replace advice given to you by your health care provider. Make sure you discuss any questions you have with your health care provider. Document Released: 05/24/2015 Document Revised: 01/15/2016 Document Reviewed: 02/26/2015 Elsevier Interactive Patient Education  2018 [place] Eczema Dyshidrotic eczema (pompholyx) is a type of eczema that causes very itchy (pruritic), fluid-filled blisters (vesicles) to form on the hands and feet. It can affect people of any age, but is more common before the age of 6. There is no cure, but treatment and certain lifestyle changes can help relieve symptoms. What are the causes? The cause of this condition is not known. What increases the risk? You are more likely to develop this condition if:  You wash your hands frequently.  You have a personal history or family history of eczema, allergies, asthma, or hay fever.  You are allergic to metals such as nickel or cobalt.  You work with cement.  You smoke.  What are the signs or symptoms? Symptoms of this condition may affect the hands, feet, or both. Symptoms may come and go (recur), and may include:  Severe itching, which may happen before blisters appear.  Blisters. These may form suddenly. ? In the early stages, blisters may form near the fingertips. ? In severe cases, blisters may grow to large blister masses (bullae). ? Blisters resolve in 2-3 weeks without bursting. This is followed by a dry phase in which itching eases.  Pain and swelling.  Cracks or long, narrow openings (fissures) in the skin.  Severe dryness.  Ridges on the nails.  How is this diagnosed? This condition may be diagnosed based on:  A physical exam.  Your symptoms.  Your medical history.  Skin scrapings to rule out a fungal infection.  Testing a swab of fluid for bacteria (culture).  Removing and checking a small piece of skin (biopsy) in order to  test for infection or to rule out other conditions.  Skin patch tests. These tests involve taking patches that contain possible allergens and placing them on your back. Your health care provider will wait a few days and then check to see if an allergic reaction occurred. These tests may be done if your health care provider suspects allergic reactions, or to rule out other types of eczema.  You may be referred to a health care provider who specializes in the skin (dermatologist) to help diagnose and treat this condition. How is this treated? There is no cure for this condition, but treatment can help relieve symptoms. Depending on how many blisters you have and how severe they are, your health care provider may suggest:  Avoiding allergens, irritants, or triggers that worsen symptoms. This may involve lifestyle changes such as: ? Using different lotions or soaps. ? Avoiding hot weather or places that will cause you to sweat a lot. ? Managing stress with coping techniques such as relaxation and exercise, and asking for help when you need it. ? Diet changes as recommended by your health care provider.  Using a clean, damp towel (cool compress) to relieve symptoms.  Soaking in  a bath that contains a type of salt that relieves irritation (aluminum acetate soaks).  Medicine taken by mouth to reduce itching (oral antihistamines).  Medicine applied to the skin to reduce swelling and irritation (topical corticosteroids).  Medicine that reduces the activity of the body's disease-fighting system (immunosuppressants) to treat inflammation. This may be given in severe cases.  Antibiotic medicines to treat bacterial infection.  Light therapy (phototherapy). This involves shining ultraviolet (UV) light on affected skin in order to reduce itchiness and inflammation.  Follow these instructions at home: Bathing and skin care  Wash skin gently. After bathing or washing your hands, pat your skin dry.  Avoid rubbing your skin.  Remove all jewelry before bathing. If the skin under the jewelry stays wet, blisters may form or get worse.  Apply cool compresses as told by your health care provider: ? Soak a clean towel in cool water. ? Wring out excess water until towel is damp. ? Place the towel over affected skin. Leave the towel on for 20 minutes at a time, 2-3 times a day.  Use mild soaps, cleansers, and lotions that do not contain dyes, perfumes, or other irritants.  Keep your skin hydrated. To do this: ? Avoid very hot water. Take lukewarm baths or showers. ? Apply moisturizer within three minutes of bathing. This locks in moisture. Medicines  Take and apply over-the-counter and prescription medicines only as told by your health care provider.  If you were prescribed antibiotic medicine, take or apply it as told by your health care provider. Do not stop using the antibiotic even if you start to feel better. General instructions  Identify and avoid triggers and allergens.  Keep fingernails short to avoid breaking open the skin while scratching.  Use waterproof gloves to protect your hands when doing work that keeps your hands wet for a long time.  Wear socks to keep your feet dry.  Do not use any products that contain nicotine or tobacco, such as cigarettes and e-cigarettes. If you need help quitting, ask your health care provider.  Keep all follow-up visits as told by your health care provider. This is important. Contact a health care provider if:  You have symptoms that do not go away.  You have signs of infection, such as: ? Crusting, pus, or a bad smell. ? More redness, swelling, or pain. ? Increased warmth in the affected area. Summary  Dyshidrotic eczema (pompholyx) is a type of eczema that causes very itchy (pruritic), fluid-filled blisters (vesicles) to form on the hands and feet.  The cause of this condition is not known.  There is no cure for this condition,  but treatment can help relieve symptoms. Treatment depends on how many blisters you have and how severe they are.  Use mild soaps, cleansers, and lotions that do not contain dyes, perfumes, or other irritants. Keep your skin hydrated. This information is not intended to replace advice given to you by your health care provider. Make sure you discuss any questions you have with your health care provider. Document Released: 09/10/2016 Document Revised: 09/10/2016 Document Reviewed: 09/10/2016 Elsevier Interactive Patient Education  2018 [employer].

## 2018-01-31 NOTE — Assessment & Plan Note (Signed)
Not on any medications Recheck lipid panel and CMP

## 2018-01-31 NOTE — Assessment & Plan Note (Signed)
Benign dependent edema Discussed is likely worse of the right leg secondary to recent knee surgery Advised compression socks

## 2018-01-31 NOTE — Assessment & Plan Note (Signed)
Well controlled on daily PPI and H2 blocker Continue current regimen

## 2018-01-31 NOTE — Assessment & Plan Note (Signed)
Discussed avoiding prolonged submersion of hands Discussed importance of moisturizing Use triamcinolone ointment twice daily

## 2018-01-31 NOTE — Assessment & Plan Note (Signed)
Continue calcium and vitamin D supplement Discussed importance of regular exercise Recheck DEXA next year

## 2018-01-31 NOTE — Progress Notes (Signed)
Patient: Amanda Carpenter, Female    DOB: 04/12/1946, 72 y.o.   MRN: [id number] Visit Date: 01/31/2018  Today's Provider: Lavon Paganini, MD   Chief Complaint  Patient presents with  . Medicare Wellness   Subjective:  I, Amanda Carpenter, CMA, am acting as a scribe for Lavon Paganini, MD.    Annual wellness visit Amanda Carpenter is a 72 y.o. female who presents today for her Subsequent Annual Wellness Visit. She feels well. She reports exercising 20-30 minutes at least 3 days per week riding exercise bike. She reports she is sleeping well.  R hand dryness for several months.  Not sure of any new exposures.  Wonders why it is only her R hand.  R leg swelling intermittently since TKR of that same side.  States that L TKR was not problematic at all, but she had several complications since R TKR.  Swelling gets worse throughout the day and better overnight.  GERD is well controlled on lower dose Omeprazole (daily instead of BID).  She also uses Zantac qhs.  She is due for mammogram.  She also had osteopenia of femoral neck on last DEXA in 04/2016.  She would like to wait until next year to repeat DEXA.  -----------------------------------------------------------   Review of Systems  Constitutional: Negative.   HENT: Positive for tinnitus.   Eyes: Negative.   Respiratory: Negative.   Cardiovascular: Positive for leg swelling.  Gastrointestinal: Negative.   Endocrine: Negative.   Genitourinary: Negative.   Musculoskeletal: Negative.   Skin: Negative.        Skin dryness  Allergic/Immunologic: Negative.   Neurological: Negative.   Hematological: Negative.   Psychiatric/Behavioral: Negative.     Social History   Socioeconomic History  . Marital status: Married    Spouse name: Deidre Ala  . Number of children: 2  . Years of education: bachelor's  . Highest education level: Not on file  Occupational History    Employer: Engineer, maintenance  Social Needs  . Financial resource strain:  Not on file  . Food insecurity:    Worry: Not on file    Inability: Not on file  . Transportation needs:    Medical: Not on file    Non-medical: Not on file  Tobacco Use  . Smoking status: Former Smoker    Packs/day: 1.00    Years: 10.00    Pack years: 10.00    Types: Cigarettes    Last attempt to quit: 05/11/1985    Years since quitting: 32.7  . Smokeless tobacco: Never Used  Substance and Sexual Activity  . Alcohol use: Yes    Comment: SOCIAL 3 X YEAR  . Drug use: No  . Sexual activity: Never  Lifestyle  . Physical activity:    Days per week: Not on file    Minutes per session: Not on file  . Stress: Not on file  Relationships  . Social connections:    Talks on phone: Not on file    Gets together: Not on file    Attends religious service: Not on file    Active member of club or organization: Not on file    Attends meetings of clubs or organizations: Not on file    Relationship status: Not on file  . Intimate partner violence:    Fear of current or ex partner: Not on file    Emotionally abused: Not on file    Physically abused: Not on file    Forced sexual activity: Not on file  Other Topics  Concern  . Not on file  Social History Narrative  . Not on file    Patient Active Problem List   Diagnosis Date Noted  . Lower extremity edema 01/25/2017  . Allergic rhinitis 10/02/2014  . Arthritis 10/02/2014  . 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 Accumulation of fluid in tissues 10/02/2014  . Fatigue 10/02/2014  . Acid reflux 10/02/2014  . Bleeding gastrointestinal 10/02/2014  . 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 Hypercholesteremia 10/02/2014  . Gonalgia 10/02/2014  . Adiposity 10/02/2014  . Osteopenia 10/02/2014  . Breath shortness 10/02/2014  . Skin thinning 10/02/2014  . 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 Avitaminosis D 10/02/2014  . 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 OA (osteoarthritis) of knee 04/02/2014  . Borderline diabetes  08/14/2009    Past Surgical History:  Procedure Laterality Date  . CHOLECYSTECTOMY  1990   Dr. Bary Castilla  . COLONSCOPY AND ENDOSCOPY  2014  . KNEE ARTHROPLASTY Left 04/2014  . TOTAL KNEE ARTHROPLASTY Right 04/20/2016   Procedure: RIGHT TOTAL KNEE ARTHROPLASTY;  Surgeon: Gaynelle Arabian, MD;  Location: [facility];  Service: Orthopedics;  Laterality: Right;  . 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     Previous Medications   DICLOFENAC SODIUM (VOLTAREN) 1 % GEL    Apply 2 g topically 2 (two) times daily.   FUROSEMIDE (LASIX) 20 MG TABLET    Take 20 mg by mouth daily as needed for fluid.   KETOCONAZOLE (NIZORAL) 2 % CREAM    Apply 1 application topically daily. To affected areas.   OMEPRAZOLE (PRILOSEC) 20 MG CAPSULE    Take 1 capsule (20 mg total) by mouth 2 (two) times daily before a meal.   RANITIDINE (ZANTAC) 150 MG CAPSULE    Take 150 mg by mouth 2 (two) times daily.   UNABLE TO FIND    Med Name: Sulfur Crystals- 1.5 teaspoons in the morning   VITAMIN D, ERGOCALCIFEROL, 2000 UNITS CAPS    Take 2,000 Units by mouth daily.    Patient Care Team: Jerrol Banana., MD as PCP - General (Family Medicine)      Objective:   Vitals: BP (!) 142/60 (BP Location: Right Arm, Patient Position: Sitting, Cuff Size: Large)   Pulse 66   Temp 98.6 F (37 C) (Oral)   Ht 5\' 2"  (1.575 m)   Wt 230 lb 6.4 oz (104.5 kg)   SpO2 98%   BMI 42.14 kg/m   Physical Exam  Constitutional: She is oriented to person, place, and time. She appears well-developed and well-nourished. No distress.  HENT:  Head: Normocephalic and atraumatic.  Right Ear: External ear normal.  Left Ear: External ear normal.  Nose: Nose normal.  Mouth/Throat: Oropharynx is clear and moist.  Eyes: Pupils are equal, round, and reactive to  light. Conjunctivae and EOM are normal. No scleral icterus.  Neck: Neck supple. No thyromegaly present.  Cardiovascular: Normal rate, regular rhythm, normal heart sounds and intact distal pulses.  No murmur heard. Pulmonary/Chest: Effort normal and breath sounds normal. No respiratory distress. She has no wheezes. She has no rales.  Abdominal: Soft. Bowel sounds are normal. She exhibits no distension. There is no tenderness. There is no rebound and no guarding.  Musculoskeletal: She exhibits no edema or deformity.  Lymphadenopathy:    She has no cervical adenopathy.  Neurological: She is alert and oriented to person, place, and time.  Skin: Skin is warm and dry. Capillary refill takes less than 2 seconds.  Dyshidrotic eczema of R hand  Psychiatric: She has a normal mood and affect. Her behavior is normal.  Vitals reviewed.   Activities of Daily Living In your present state of health, do you have any difficulty performing the following activities: 01/31/2018  Hearing? N  Vision? N  Difficulty concentrating or making decisions? Y  Walking or climbing stairs? Y  Dressing or bathing? N  Doing errands, shopping? N  Some recent data might be hidden    Fall Risk Assessment Fall Risk  01/31/2018 01/21/2017 01/23/2016 01/09/2015  Falls in the past year? No No No No     Depression Screen PHQ 2/9 Scores 01/31/2018 01/21/2017 01/23/2016 01/09/2015  PHQ - 2 Score 0 0 0 0  PHQ- 9 Score 0 - - -    Cognitive Testing - 6-CIT  Correct? Score   What year is it? yes 0 0 or 4  What month is it? yes 0 0 or 3  Memorize:    Pia Mau,  41,  [address]  [place],      What time is it? (within 1 hour) yes 0 0 or 3  Count backwards from 20 yes 0 0, 2, or 4  Name the months of the year yes 0 0, 2, or 4  Repeat name & address above yes 0 0, 2, 4, 6, 8, or 10       TOTAL SCORE  0/28   Interpretation:  Normal  Normal (0-7) Abnormal (8-28)    Assessment & Plan:     Annual Wellness Visit  Reviewed  patient's Family Medical History Reviewed and updated list of patient's medical providers Assessment of cognitive impairment was done Assessed patient's functional ability Established a written schedule for health screening [place] Completed and Reviewed  Exercise Activities and Dietary recommendations Goals    . Exercise 150 minutes per week (moderate activity)    . Increase exercise     Recommend increasing exercise to 2 days a week for 1 hour session.        Immunization History  Administered Date(s) Administered  . Pneumococcal Conjugate-13 01/09/2015  . Pneumococcal Polysaccharide-23 11/02/2012  . Td 05/20/2005  . Tdap 02/16/2011    Health Maintenance  Topic Date Due  . INFLUENZA VACCINE  09/08/2018 (Originally 12/09/2017)  . MAMMOGRAM  04/16/2018  . TETANUS/TDAP  02/15/2021  . COLONOSCOPY  11/30/2022  . DEXA SCAN  Completed  . Hepatitis C Screening  Completed  . PNA vac Low Risk Adult  Completed     Discussed health benefits of physical activity, and encouraged her to engage in regular exercise appropriate for her age and condition.    ------------------------------------------------------------------------------------------------------------  Problem List Items Addressed This Visit      Digestive   Acid reflux    Well controlled on daily PPI and H2 blocker Continue current regimen      Relevant Medications   omeprazole (PRILOSEC) 20 MG capsule   Other Relevant Orders   CBC with Differential     Musculoskeletal and Integument   Osteopenia    Continue calcium and vitamin D supplement Discussed importance of regular exercise Recheck DEXA next year      Dyshidrotic eczema    Discussed avoiding prolonged submersion of hands Discussed importance of moisturizing Use triamcinolone  ointment twice daily        Other   Hypercholesteremia    Not on any medications Recheck lipid panel and CMP      Relevant Orders   Lipid Profile    Comprehensive Metabolic Panel (CMET)   Morbid obesity (HCC)    Discussed diet and exercise      Borderline diabetes    Recheck A1c Discussed diet and exercise      Relevant Orders   Hemoglobin A1c   Lower extremity edema    Benign dependent edema Discussed is likely worse of the right leg secondary to recent knee surgery Advised compression socks      Relevant Orders   Comprehensive Metabolic Panel (CMET)    Other Visit Diagnoses    Medicare annual wellness visit, subsequent    -  Primary   Screening for breast cancer       Relevant Orders   MM DIAG BREAST TOMO BILATERAL       Return in about 1 year (around 02/01/2019) for CPE.   The entirety of the information documented in the History of Present Illness, Review of Systems and Physical Exam were personally obtained by me. Portions of this information were initially documented by Amanda Carpenter, CMA and reviewed by me for thoroughness and accuracy.    Virginia Crews, MD, MPH [facility] 01/31/2018 12:27 PM

## 2018-02-01 LAB — COMPREHENSIVE METABOLIC PANEL
A/G RATIO: 1.9 (ref 1.2–2.2)
ALBUMIN: 4.2 g/dL (ref 3.5–4.8)
ALT: 11 IU/L (ref 0–32)
AST: 18 IU/L (ref 0–40)
Alkaline Phosphatase: 113 IU/L (ref 39–117)
BUN / CREAT RATIO: 12 (ref 12–28)
BUN: 10 mg/dL (ref 8–27)
Bilirubin Total: 0.3 mg/dL (ref 0.0–1.2)
CALCIUM: 9.2 mg/dL (ref 8.7–10.3)
CO2: 23 mmol/L (ref 20–29)
Chloride: 102 mmol/L (ref 96–106)
Creatinine, Ser: 0.86 mg/dL (ref 0.57–1.00)
GFR calc Af Amer: 78 mL/min/{1.73_m2} (ref >59)
GFR, EST NON AFRICAN AMERICAN: 67 mL/min/{1.73_m2} (ref >59)
GLOBULIN, TOTAL: 2.2 g/dL (ref 1.5–4.5)
Glucose: 91 mg/dL (ref 65–99)
POTASSIUM: 4.4 mmol/L (ref 3.5–5.2)
SODIUM: 140 mmol/L (ref 134–144)
Total Protein: 6.4 g/dL (ref 6.0–8.5)

## 2018-02-01 LAB — CBC WITH DIFFERENTIAL/PLATELET
BASOS: 1 %
Basophils Absolute: 0.1 10*3/uL (ref 0.0–0.2)
EOS (ABSOLUTE): 0.1 10*3/uL (ref 0.0–0.4)
EOS: 2 %
HEMATOCRIT: 40.6 % (ref 34.0–46.6)
Hemoglobin: 12.8 g/dL (ref 11.1–15.9)
IMMATURE GRANULOCYTES: 0 %
Immature Grans (Abs): 0 10*3/uL (ref 0.0–0.1)
Lymphocytes Absolute: 1.9 10*3/uL (ref 0.7–3.1)
Lymphs: 28 %
MCH: 27.1 pg (ref 26.6–33.0)
MCHC: 31.5 g/dL (ref 31.5–35.7)
MCV: 86 fL (ref 79–97)
MONOS ABS: 0.5 10*3/uL (ref 0.1–0.9)
Monocytes: 8 %
NEUTROS PCT: 61 %
Neutrophils Absolute: 4.1 10*3/uL (ref 1.4–7.0)
Platelets: 262 10*3/uL (ref 150–450)
RBC: 4.72 x10E6/uL (ref 3.77–5.28)
RDW: 13.3 % (ref 12.3–15.4)
WBC: 6.8 10*3/uL (ref 3.4–10.8)

## 2018-02-01 LAB — LIPID PANEL
CHOLESTEROL TOTAL: 195 mg/dL (ref 100–199)
Chol/HDL Ratio: 3.4 ratio (ref 0.0–4.4)
HDL: 57 mg/dL (ref >39)
LDL Calculated: 116 mg/dL — ABNORMAL HIGH (ref 0–99)
TRIGLYCERIDES: 111 mg/dL (ref 0–149)
VLDL Cholesterol Cal: 22 mg/dL (ref 5–40)

## 2018-02-01 LAB — HEMOGLOBIN A1C
Est. average glucose Bld gHb Est-mCnc: 120 mg/dL
Hgb A1c MFr Bld: 5.8 % — ABNORMAL HIGH (ref 4.8–5.6)

## 2018-02-21 ENCOUNTER — Ambulatory Visit
Admission: RE | Admit: 2018-02-21 | Discharge: 2018-02-21 | Disposition: A | Discharge status: Home / Self Care | Payer: PPO | Source: Ambulatory Visit | Attending: Family Medicine | Admitting: Family Medicine

## 2018-02-21 DIAGNOSIS — Z1239 Encounter for other screening for malignant neoplasm of breast: Secondary | ICD-10-CM

## 2018-02-21 DIAGNOSIS — Z1231 Encounter for screening mammogram for malignant neoplasm of breast: Secondary | ICD-10-CM | POA: Insufficient documentation

## 2018-02-21 HISTORY — DX: Personal history of irradiation: Z92.3

## 2019-02-06 ENCOUNTER — Other Ambulatory Visit: Payer: Self-pay

## 2019-02-06 ENCOUNTER — Ambulatory Visit (INDEPENDENT_AMBULATORY_CARE_PROVIDER_SITE_OTHER): Payer: PPO | Admitting: Family Medicine

## 2019-02-06 ENCOUNTER — Encounter: Payer: Self-pay | Admitting: Family Medicine

## 2019-02-06 VITALS — BP 133/77 | HR 66 | Temp 96.8°F | Ht 63.0 in | Wt 232.0 lb

## 2019-02-06 DIAGNOSIS — E7800 Pure hypercholesterolemia, unspecified: Secondary | ICD-10-CM | POA: Diagnosis not present

## 2019-02-06 DIAGNOSIS — Z1239 Encounter for other screening for malignant neoplasm of breast: Secondary | ICD-10-CM

## 2019-02-06 DIAGNOSIS — K219 Gastro-esophageal reflux disease without esophagitis: Secondary | ICD-10-CM

## 2019-02-06 DIAGNOSIS — M85852 Other specified disorders of bone density and structure, left thigh: Secondary | ICD-10-CM | POA: Diagnosis not present

## 2019-02-06 DIAGNOSIS — M85851 Other specified disorders of bone density and structure, right thigh: Secondary | ICD-10-CM | POA: Diagnosis not present

## 2019-02-06 DIAGNOSIS — J42 Unspecified chronic bronchitis: Secondary | ICD-10-CM | POA: Diagnosis not present

## 2019-02-06 DIAGNOSIS — R7303 Prediabetes: Secondary | ICD-10-CM | POA: Diagnosis not present

## 2019-02-06 DIAGNOSIS — D692 Other nonthrombocytopenic purpura: Secondary | ICD-10-CM | POA: Diagnosis not present

## 2019-02-06 DIAGNOSIS — R351 Nocturia: Secondary | ICD-10-CM

## 2019-02-06 DIAGNOSIS — Z0000 Encounter for general adult medical examination without abnormal findings: Secondary | ICD-10-CM

## 2019-02-06 MED ORDER — OXYBUTYNIN CHLORIDE ER 5 MG PO TB24: 5.0000 mg | ORAL_TABLET | Freq: Every day | ORAL | 5 refills | Status: DC

## 2019-02-06 MED ORDER — ESOMEPRAZOLE MAGNESIUM 20 MG PO CPDR: 20.0000 mg | DELAYED_RELEASE_CAPSULE | Freq: Every day | ORAL | 3 refills | Status: DC

## 2019-02-06 NOTE — Assessment & Plan Note (Signed)
Not on statin Repeat CMP and FLP

## 2019-02-06 NOTE — Assessment & Plan Note (Signed)
Encourage low carb diet Repeat A1c

## 2019-02-06 NOTE — Progress Notes (Signed)
Patient: Amanda Carpenter, Female    DOB: [birth year], 73 y.o.   MRN: [id number] Visit Date: 02/06/2019  Today's Provider: Lavon Paganini, MD   Chief Complaint  Patient presents with  . Annual Exam   Subjective:    Annual wellness visit Amanda Carpenter is a 73 y.o. female who presents today for her Subsequent Annual Wellness Visit. She feels well.  Pt would like to change from omeprazole to Nexium.  Pt reports Nexium seems to be working better.   She reports exercising regularly. She reports she is sleeping fairly well.  Pt report waking up several times a night to urinate.  Some difficulty going back to sleep -----------------------------------------------------------   Review of Systems  Constitutional: Negative.   HENT: Negative.   Eyes: Negative.   Respiratory: Negative.   Cardiovascular: Negative.   Gastrointestinal: Negative.   Endocrine: Negative.   Genitourinary: Positive for frequency. Negative for decreased urine volume, difficulty urinating, dyspareunia, dysuria, enuresis, flank pain, genital sores, hematuria, menstrual problem, pelvic pain, urgency, vaginal bleeding, vaginal discharge and vaginal pain.  Musculoskeletal: Positive for arthralgias. Negative for back pain, gait problem, joint swelling, myalgias, neck pain and neck stiffness.  Skin: Negative.   Allergic/Immunologic: Negative.   Neurological: Negative.   Hematological: Negative for adenopathy. Bruises/bleeds easily.  Psychiatric/Behavioral: Negative.     Social History   Socioeconomic History  . Marital status: Married    Spouse name: Deidre Ala  . Number of children: 2  . Years of education: bachelor's  . Highest education level: Not on file  Occupational History    Employer: Engineer, maintenance  Social Needs  . Financial resource strain: Not on file  . Food insecurity    Worry: Not on file    Inability: Not on file  . Transportation needs    Medical: Not on file    Non-medical: Not on file  Tobacco Use  .  Smoking status: Former Smoker    Packs/day: 1.00    Years: 10.00    Pack years: 10.00    Types: Cigarettes    Quit date: 05/11/1985    Years since quitting: 33.7  . Smokeless tobacco: Never Used  Substance and Sexual Activity  . Alcohol use: Yes    Comment: SOCIAL 3 X YEAR  . Drug use: No  . Sexual activity: Never  Lifestyle  . Physical activity    Days per week: Not on file    Minutes per session: Not on file  . Stress: Not on file  Relationships  . Social Herbalist on phone: Not on file    Gets together: Not on file    Attends religious service: Not on file    Active member of club or organization: Not on file    Attends meetings of clubs or organizations: Not on file    Relationship status: Not on file  . Intimate partner violence    Fear of current or ex partner: Not on file    Emotionally abused: Not on file    Physically abused: Not on file    Forced sexual activity: Not on file  Other Topics Concern  . Not on file  Social History Narrative  . Not on file    Patient Active Problem List   Diagnosis Date Noted  . Dyshidrotic eczema 01/31/2018  . Lower extremity edema 01/25/2017  . Allergic rhinitis 10/02/2014  . Arthritis 10/02/2014  . 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 Fatigue 10/02/2014  . Acid reflux 10/02/2014  . Bleeding  gastrointestinal 10/02/2014  . 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 Hypercholesteremia 10/02/2014  . Gonalgia 10/02/2014  . Morbid obesity ([place]) 10/02/2014  . Osteopenia 10/02/2014  . Breath shortness 10/02/2014  . Skin thinning 10/02/2014  . 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 Avitaminosis D 10/02/2014  . 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 OA (osteoarthritis) of knee 04/02/2014  . Borderline diabetes 08/14/2009    Past Surgical History:  Procedure Laterality Date  . CHOLECYSTECTOMY  1990   Dr. Bary Castilla  . COLONSCOPY AND ENDOSCOPY  2014  . KNEE ARTHROPLASTY Left 04/2014  . TOTAL KNEE ARTHROPLASTY  Right 04/20/2016   Procedure: RIGHT TOTAL KNEE ARTHROPLASTY;  Surgeon: Gaynelle Arabian, MD;  Location: [facility];  Service: Orthopedics;  Laterality: Right;  . 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     Previous Medications   DICLOFENAC SODIUM (VOLTAREN) 1 % GEL    Apply 2 g topically 2 (two) times daily.   ESOMEPRAZOLE (NEXIUM) 20 MG CAPSULE    Take 20 mg by mouth daily at 12 noon.   FUROSEMIDE (LASIX) 20 MG TABLET    Take 20 mg by mouth daily as needed for fluid.   KETOCONAZOLE (NIZORAL) 2 % CREAM    Apply 1 application topically daily. To affected areas.   OMEPRAZOLE (PRILOSEC) 20 MG CAPSULE    Take 1 capsule (20 mg total) by mouth daily.   RANITIDINE (ZANTAC) 150 MG CAPSULE    Take 150 mg by mouth 2 (two) times daily.   TRIAMCINOLONE OINTMENT (KENALOG) 0.5 %    Apply 1 application topically 2 (two) times daily.   UNABLE TO FIND    Med Name: Sulfur Crystals- 1.5 teaspoons in the morning   VITAMIN D, ERGOCALCIFEROL, 2000 UNITS CAPS    Take 2,000 Units by mouth daily.    Patient Care Team: Virginia Crews, MD as PCP - General (Family Medicine)      Objective:   Vitals: BP 133/77 (BP Location: Right Arm, Patient Position: Sitting, Cuff Size: Normal)   Pulse 66   Temp (!) 96.8 F (36 C) (Temporal)   Ht 5\' 3"  (1.6 m)   Wt 232 lb (105.2 kg)   BMI 41.10 kg/m   Physical Exam Vitals signs reviewed.  Constitutional:      General: She is not in acute distress.    Appearance: Normal appearance. She is well-developed. She is not diaphoretic.  HENT:     Head: Normocephalic and atraumatic.     Right Ear: Tympanic membrane, ear canal and external ear normal.     Left Ear: Tympanic membrane, ear canal and external ear normal.  Eyes:     General: No scleral icterus.    Extraocular Movements:  Extraocular movements intact.     Conjunctiva/sclera: Conjunctivae normal.     Pupils: Pupils are equal, round, and reactive to light.  Neck:     Musculoskeletal: Neck supple.     Thyroid: No thyromegaly.  Cardiovascular:     Rate and Rhythm: Normal rate and regular rhythm.     Pulses: Normal pulses.     Heart sounds: Normal heart sounds. No murmur.  Pulmonary:     Effort: Pulmonary effort is normal. No respiratory distress.     Breath sounds: Normal breath sounds. No wheezing  or rales.  Abdominal:     General: There is no distension.     Palpations: Abdomen is soft.     Tenderness: There is no abdominal tenderness. There is no guarding or rebound.  Genitourinary:    Comments: Breasts: breasts appear normal, no suspicious masses, no skin or nipple changes or axillary nodes.  Musculoskeletal:        General: No deformity.     Right lower leg: No edema.     Left lower leg: No edema.  Lymphadenopathy:     Cervical: No cervical adenopathy.  Skin:    General: Skin is warm and dry.     Capillary Refill: Capillary refill takes less than 2 seconds.     Findings: No rash.     Comments: +senile purpura  Neurological:     Mental Status: She is alert and oriented to person, place, and time.  Psychiatric:        Mood and Affect: Mood normal.        Behavior: Behavior normal.        Thought Content: Thought content normal.     Activities of Daily Living In your present state of health, do you have any difficulty performing the following activities: 02/06/2019  Hearing? N  Vision? N  Difficulty concentrating or making decisions? N  Walking or climbing stairs? N  Dressing or bathing? N  Doing errands, shopping? N  Some recent data might be hidden    Fall Risk Assessment Fall Risk  02/06/2019 01/31/2018 01/21/2017 01/23/2016 01/09/2015  Falls in the past year? 0 No No No No  Number falls in past yr: 0 - - - -  Injury with Fall? 0 - - - -  Follow up Falls evaluation completed - - - -      Depression Screen PHQ 2/9 Scores 02/06/2019 01/31/2018 01/21/2017 01/23/2016  PHQ - 2 Score 0 0 0 0  PHQ- 9 Score 0 0 - -    Cognitive Testing - 6-CIT  Correct? Score   What year is it? yes 0 0 or 4  What month is it? yes 0 0 or 3  Memorize:    Pia Mau,  41,  [address],  [place],      What time is it? (within 1 hour) yes 0 0 or 3  Count backwards from 20 yes 0 0, 2, or 4  Name the months of the year yes 0 0, 2, or 4  Repeat name & address above yes 0 0, 2, 4, 6, 8, or 10       TOTAL SCORE  0/28   Interpretation:  Normal  Normal (0-7) Abnormal (8-28)       Assessment & Plan:     Annual Wellness Visit  Reviewed patient's Family Medical History Reviewed and updated list of patient's medical providers Assessment of cognitive impairment was done Assessed patient's functional ability Established a written schedule for health screening [place] Completed and Reviewed  Exercise Activities and Dietary recommendations Goals    . Exercise 150 minutes per week (moderate activity)    . Increase exercise     Recommend increasing exercise to 2 days a week for 1 hour session.        Immunization History  Administered Date(s) Administered  . Pneumococcal Conjugate-13 01/09/2015  . Pneumococcal Polysaccharide-23 11/02/2012  . Td 05/20/2005  . Tdap 02/16/2011    Health Maintenance  Topic Date Due  . INFLUENZA VACCINE  08/09/2019 (Originally 12/10/2018)  .  MAMMOGRAM  02/22/2020  . TETANUS/TDAP  02/15/2021  . COLONOSCOPY  11/30/2022  . DEXA SCAN  Completed  . Hepatitis C Screening  Completed  . PNA vac Low Risk Adult  Completed     Discussed health benefits of physical activity, and encouraged her to engage in regular exercise appropriate for her age and condition.    ------------------------------------------------------------------------------------------------------------  Problem List Items Addressed This Visit      Cardiovascular and  Mediastinum   Senile purpura ([place])     Respiratory   Chronic bronchitis ([place])    Well controlled No controller meds        Digestive   Acid reflux    Well controlled Continue nexium Discussed risk of long term PPI      Relevant Medications   esomeprazole (NEXIUM) 20 MG capsule   Other Relevant Orders   CBC w/Diff/Platelet     Musculoskeletal and Integument   Osteopenia    Continue calcium and Vit D Repeat DEXA      Relevant Orders   DG Bone Density     Other   Hypercholesteremia    Not on statin Repeat CMP and FLP      Relevant Orders   Comprehensive metabolic panel   Lipid panel   Morbid obesity ([place])    Discussed importance of healthy weight management Discussed diet and exercise       Relevant Orders   Comprehensive metabolic panel   Lipid panel   CBC w/Diff/Platelet   Borderline diabetes    Encourage low carb diet Repeat A1c      Relevant Orders   Hemoglobin A1c    Other Visit Diagnoses    Medicare annual wellness visit, subsequent    -  Primary   Encounter for annual physical exam       Screening for breast cancer       Relevant Orders   MM 3D SCREEN BREAST BILATERAL   Nocturia           Return in about 1 year (around 02/06/2020) for CPE/AWV.   The entirety of the information documented in the History of Present Illness, Review of Systems and Physical Exam were personally obtained by me. Portions of this information were initially documented by Amanda Carpenter, CMA and reviewed by me for thoroughness and accuracy.    Amanda Carpenter, Dionne Bucy, MD MPH [facility]

## 2019-02-06 NOTE — Assessment & Plan Note (Signed)
Discussed importance of healthy weight management Discussed diet and exercise  

## 2019-02-06 NOTE — Assessment & Plan Note (Signed)
Continue calcium and Vit D Repeat DEXA

## 2019-02-06 NOTE — Assessment & Plan Note (Addendum)
Well controlled Continue nexium Discussed risk of long term PPI

## 2019-02-06 NOTE — Patient Instructions (Signed)
Preventive Care 74 Years and Older, Female Preventive care refers to lifestyle choices and visits with your health care provider that can promote health and wellness. This includes:  A yearly physical exam. This is also called an annual well check.  Regular dental and eye exams.  Immunizations.  Screening for certain conditions.  Healthy lifestyle choices, such as diet and exercise. What can I expect for my preventive care visit? Physical exam Your health care provider will check:  Height and weight. These may be used to calculate body mass index (BMI), which is a measurement that tells if you are at a healthy weight.  Heart rate and blood pressure.  Your skin for abnormal spots. Counseling Your health care provider may ask you questions about:  Alcohol, tobacco, and drug use.  Emotional well-being.  Home and relationship well-being.  Sexual activity.  Eating habits.  History of falls.  Memory and ability to understand (cognition).  Work and work Statistician.  Pregnancy and menstrual history. What immunizations do I need?  Influenza (flu) vaccine  This is recommended every year. Tetanus, diphtheria, and pertussis (Tdap) vaccine  You may need a Td booster every 10 years. Varicella (chickenpox) vaccine  You may need this vaccine if you have not already been vaccinated. Zoster (shingles) vaccine  You may need this after age 33. Pneumococcal conjugate (PCV13) vaccine  One dose is recommended after age 33. Pneumococcal polysaccharide (PPSV23) vaccine  One dose is recommended after age 72. Measles, mumps, and rubella (MMR) vaccine  You may need at least one dose of MMR if you were born in 1957 or later. You may also need a second dose. Meningococcal conjugate (MenACWY) vaccine  You may need this if you have certain conditions. Hepatitis A vaccine  You may need this if you have certain conditions or if you travel or work in places where you may be exposed  to hepatitis A. Hepatitis B vaccine  You may need this if you have certain conditions or if you travel or work in places where you may be exposed to hepatitis B. Haemophilus influenzae type b (Hib) vaccine  You may need this if you have certain conditions. You may receive vaccines as individual doses or as more than one vaccine together in one shot (combination vaccines). Talk with your health care provider about the risks and benefits of combination vaccines. What tests do I need? Blood tests  Lipid and cholesterol levels. These may be checked every 5 years, or more frequently depending on your overall health.  Hepatitis C test.  Hepatitis B test. Screening  Lung cancer screening. You may have this screening every year starting at age 39 if you have a 30-pack-year history of smoking and currently smoke or have quit within the past 15 years.  Colorectal cancer screening. All adults should have this screening starting at age 36 and continuing until age 15. Your health care provider may recommend screening at age 23 if you are at increased risk. You will have tests every 1-10 years, depending on your results and the type of screening test.  Diabetes screening. This is done by checking your blood sugar (glucose) after you have not eaten for a while (fasting). You may have this done every 1-3 years.  Mammogram. This may be done every 1-2 years. Talk with your health care provider about how often you should have regular mammograms.  BRCA-related cancer screening. This may be done if you have a family history of breast, ovarian, tubal, or peritoneal cancers.  Other tests  Sexually transmitted disease (STD) testing.  Bone density scan. This is done to screen for osteoporosis. You may have this done starting at age 55. Follow these instructions at home: Eating and drinking  Eat a diet that includes fresh fruits and vegetables, whole grains, lean protein, and low-fat dairy products. Limit  your intake of foods with high amounts of sugar, saturated fats, and salt.  Take vitamin and mineral supplements as recommended by your health care provider.  Do not drink alcohol if your health care provider tells you not to drink.  If you drink alcohol: ? Limit how much you have to 0-1 drink a day. ? Be aware of how much alcohol is in your drink. In the [place] one drink equals one 12 oz bottle of beer (355 mL), one 5 oz glass of wine (148 mL), or one 1 oz glass of hard liquor (44 mL). Lifestyle  Take daily care of your teeth and gums.  Stay active. Exercise for at least 30 minutes on 5 or more days each week.  Do not use any products that contain nicotine or tobacco, such as cigarettes, e-cigarettes, and chewing tobacco. If you need help quitting, ask your health care provider.  If you are sexually active, practice safe sex. Use a condom or other form of protection in order to prevent STIs (sexually transmitted infections).  Talk with your health care provider about taking a low-dose aspirin or statin. What's next?  Go to your health care provider once a year for a well check visit.  Ask your health care provider how often you should have your eyes and teeth checked.  Stay up to date on all vaccines. This information is not intended to replace advice given to you by your health care provider. Make sure you discuss any questions you have with your health care provider. Document Released: 05/24/2015 Document Revised: 04/21/2018 Document Reviewed: 04/21/2018 Elsevier Patient Education  2020 [employer].

## 2019-02-06 NOTE — Assessment & Plan Note (Signed)
Well controlled No controller meds

## 2019-02-07 ENCOUNTER — Telehealth: Payer: Self-pay

## 2019-02-07 LAB — COMPREHENSIVE METABOLIC PANEL
ALT: 14 IU/L (ref 0–32)
AST: 16 IU/L (ref 0–40)
Albumin/Globulin Ratio: 1.6 (ref 1.2–2.2)
Albumin: 4.1 g/dL (ref 3.7–4.7)
Alkaline Phosphatase: 131 IU/L — ABNORMAL HIGH (ref 39–117)
BUN/Creatinine Ratio: 12 (ref 12–28)
BUN: 12 mg/dL (ref 8–27)
Bilirubin Total: 0.4 mg/dL (ref 0.0–1.2)
CO2: 23 mmol/L (ref 20–29)
Calcium: 9.3 mg/dL (ref 8.7–10.3)
Chloride: 102 mmol/L (ref 96–106)
Creatinine, Ser: 1.02 mg/dL — ABNORMAL HIGH (ref 0.57–1.00)
GFR calc Af Amer: 63 mL/min/{1.73_m2} (ref >59)
GFR calc non Af Amer: 54 mL/min/{1.73_m2} — ABNORMAL LOW (ref >59)
Globulin, Total: 2.6 g/dL (ref 1.5–4.5)
Glucose: 96 mg/dL (ref 65–99)
Potassium: 4.6 mmol/L (ref 3.5–5.2)
Sodium: 140 mmol/L (ref 134–144)
Total Protein: 6.7 g/dL (ref 6.0–8.5)

## 2019-02-07 LAB — CBC WITH DIFFERENTIAL/PLATELET
Basophils Absolute: 0.1 10*3/uL (ref 0.0–0.2)
Basos: 1 %
EOS (ABSOLUTE): 0.2 10*3/uL (ref 0.0–0.4)
Eos: 3 %
Hematocrit: 39.9 % (ref 34.0–46.6)
Hemoglobin: 12.7 g/dL (ref 11.1–15.9)
Immature Grans (Abs): 0 10*3/uL (ref 0.0–0.1)
Immature Granulocytes: 0 %
Lymphocytes Absolute: 2.2 10*3/uL (ref 0.7–3.1)
Lymphs: 29 %
MCH: 26.9 pg (ref 26.6–33.0)
MCHC: 31.8 g/dL (ref 31.5–35.7)
MCV: 85 fL (ref 79–97)
Monocytes Absolute: 0.6 10*3/uL (ref 0.1–0.9)
Monocytes: 7 %
Neutrophils Absolute: 4.6 10*3/uL (ref 1.4–7.0)
Neutrophils: 60 %
Platelets: 296 10*3/uL (ref 150–450)
RBC: 4.72 x10E6/uL (ref 3.77–5.28)
RDW: 14.2 % (ref 11.7–15.4)
WBC: 7.6 10*3/uL (ref 3.4–10.8)

## 2019-02-07 LAB — LIPID PANEL
Chol/HDL Ratio: 3.6 ratio (ref 0.0–4.4)
Cholesterol, Total: 211 mg/dL — ABNORMAL HIGH (ref 100–199)
HDL: 58 mg/dL (ref >39)
LDL Chol Calc (NIH): 135 mg/dL — ABNORMAL HIGH (ref 0–99)
Triglycerides: 102 mg/dL (ref 0–149)
VLDL Cholesterol Cal: 18 mg/dL (ref 5–40)

## 2019-02-07 LAB — HEMOGLOBIN A1C
Est. average glucose Bld gHb Est-mCnc: 126 mg/dL
Hgb A1c MFr Bld: 6 % — ABNORMAL HIGH (ref 4.8–5.6)

## 2019-02-07 NOTE — Telephone Encounter (Signed)
Patient advised.

## 2019-02-07 NOTE — Telephone Encounter (Signed)
-----   Message from Virginia Crews, MD sent at 02/07/2019  9:15 AM EDT ----- Slight decrease in kidney function over last year - be sure to only take NSAIDs sparingly and stay hydrated.  Cholesterol has increased over last year as well.  I recommend diet low in saturated fat and regular exercise - 30 min at least 5 times per week.  You remain pre-diabetic. Blood counts are normal.

## 2019-03-20 ENCOUNTER — Ambulatory Visit
Admission: RE | Admit: 2019-03-20 | Discharge: 2019-03-20 | Disposition: A | Discharge status: Home / Self Care | Payer: PPO | Source: Ambulatory Visit | Attending: Family Medicine | Admitting: Family Medicine

## 2019-03-20 ENCOUNTER — Other Ambulatory Visit: Payer: Self-pay

## 2019-03-20 DIAGNOSIS — M8589 Other specified disorders of bone density and structure, multiple sites: Secondary | ICD-10-CM | POA: Diagnosis not present

## 2019-03-20 DIAGNOSIS — Z1231 Encounter for screening mammogram for malignant neoplasm of breast: Secondary | ICD-10-CM | POA: Diagnosis not present

## 2019-03-20 DIAGNOSIS — Z1239 Encounter for other screening for malignant neoplasm of breast: Secondary | ICD-10-CM

## 2019-03-20 DIAGNOSIS — M85852 Other specified disorders of bone density and structure, left thigh: Secondary | ICD-10-CM | POA: Diagnosis present

## 2019-03-20 DIAGNOSIS — M810 Age-related osteoporosis without current pathological fracture: Secondary | ICD-10-CM | POA: Diagnosis not present

## 2019-03-20 DIAGNOSIS — M85851 Other specified disorders of bone density and structure, right thigh: Secondary | ICD-10-CM

## 2019-03-20 NOTE — Telephone Encounter (Signed)
Patient advised as below. Patient verbalizes understanding and is in agreement with treatment plan. Patient agreed to starting fosamax. Please send to [facility].

## 2019-03-20 NOTE — Telephone Encounter (Signed)
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  Recommend that patient consider medication for osteoporosis, like Fosamax.

## 2019-03-21 ENCOUNTER — Other Ambulatory Visit: Payer: Self-pay | Admitting: Family Medicine

## 2019-03-21 DIAGNOSIS — R928 Other abnormal and inconclusive findings on diagnostic imaging of breast: Secondary | ICD-10-CM

## 2019-03-21 MED ORDER — ALENDRONATE SODIUM 70 MG PO TABS: 70.0000 mg | ORAL_TABLET | ORAL | 11 refills | Status: DC

## 2019-03-23 ENCOUNTER — Telehealth: Payer: Self-pay | Admitting: Family Medicine

## 2019-03-23 NOTE — Telephone Encounter (Signed)
Patient advised as below.  

## 2019-03-23 NOTE — Telephone Encounter (Signed)
Pt wanting to know if she needs to take the following together.  Concerned it may be too much calcium.  alendronate (FOSAMAX) 70 MG tablet  calcium citrate (CALCITRATE - DOSED IN MG ELEMENTAL CALCIUM) 950 MG tablet   Please call pt back at [phone] to advise.  Thanks, [employer]

## 2019-03-23 NOTE — Telephone Encounter (Signed)
OK to take together. Fosamax does not have calcium in it

## 2019-03-28 ENCOUNTER — Ambulatory Visit
Admission: RE | Admit: 2019-03-28 | Discharge: 2019-03-28 | Disposition: A | Discharge status: Home / Self Care | Payer: PPO | Source: Ambulatory Visit | Attending: Family Medicine | Admitting: Family Medicine

## 2019-03-28 DIAGNOSIS — R928 Other abnormal and inconclusive findings on diagnostic imaging of breast: Secondary | ICD-10-CM | POA: Insufficient documentation

## 2019-03-28 DIAGNOSIS — R922 Inconclusive mammogram: Secondary | ICD-10-CM | POA: Diagnosis not present

## 2019-03-29 ENCOUNTER — Telehealth: Payer: Self-pay

## 2019-03-29 NOTE — Telephone Encounter (Signed)
-----   Message from Virginia Crews, MD sent at 03/29/2019  9:56 AM EST ----- Normal mammogram. Repeat in 1 yr

## 2019-03-29 NOTE — Telephone Encounter (Signed)
Left message advising pt.  (Per DPR)  Thanks,   -Venesa Semidey  

## 2019-09-06 DIAGNOSIS — K219 Gastro-esophageal reflux disease without esophagitis: Secondary | ICD-10-CM | POA: Diagnosis not present

## 2019-09-06 DIAGNOSIS — J309 Allergic rhinitis, unspecified: Secondary | ICD-10-CM | POA: Diagnosis not present

## 2019-09-06 DIAGNOSIS — R490 Dysphonia: Secondary | ICD-10-CM | POA: Diagnosis not present

## 2019-12-05 ENCOUNTER — Telehealth: Payer: Self-pay

## 2019-12-05 NOTE — Telephone Encounter (Signed)
Copied from [place] [phone]. Topic: Appointment Scheduling - Scheduling Inquiry for Clinic >> Dec 05, 2019  1:31 PM Sheran Luz wrote: Patient states she has missed multiple calls from office. No documentation found.

## 2019-12-06 NOTE — Telephone Encounter (Signed)
I do not see in patient's chart where anyone has called her.

## 2020-01-12 ENCOUNTER — Ambulatory Visit: Payer: Self-pay

## 2020-01-12 DIAGNOSIS — M6530 Trigger finger, unspecified finger: Secondary | ICD-10-CM | POA: Diagnosis not present

## 2020-01-17 IMAGING — MG DIGITAL SCREENING BILAT W/ TOMO W/ CAD
6 of 10 series · 6 of 30 positions shown · non-contrast
Comparison: Previous exam(s).

CLINICAL DATA: Screening.

EXAM:
DIGITAL SCREENING BILATERAL MAMMOGRAM WITH TOMO AND CAD

[R AT synth-2D]
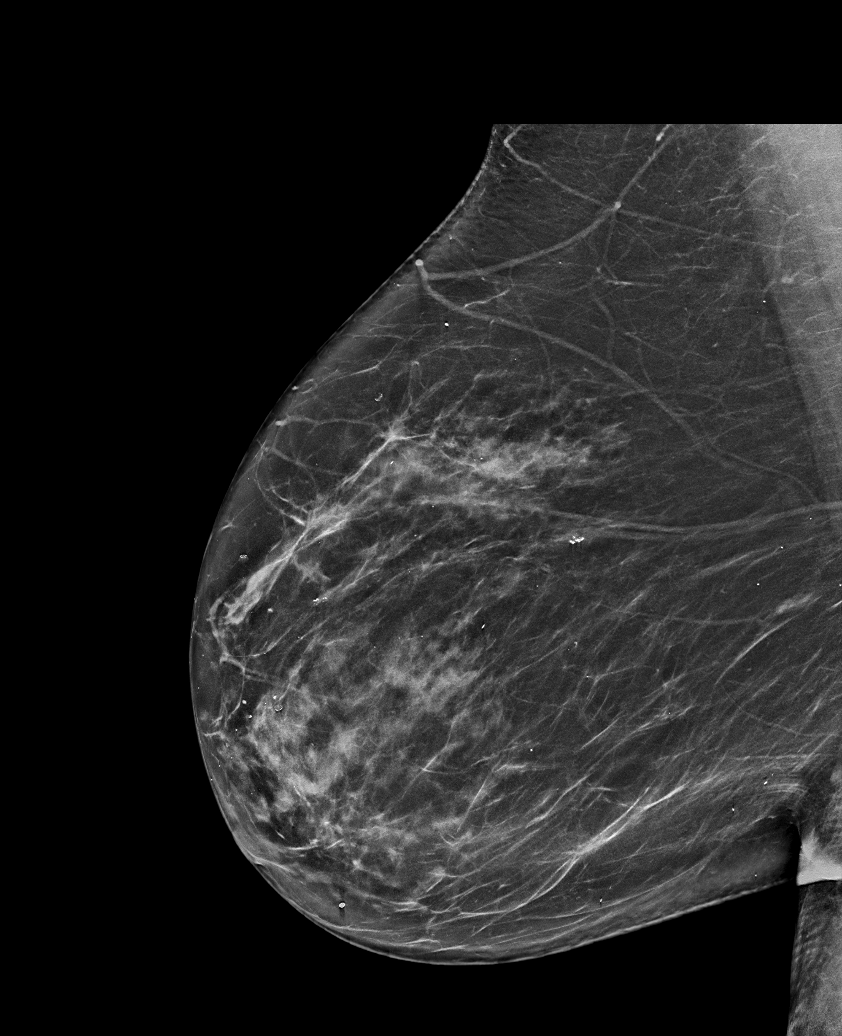

[L CC synth-2D]
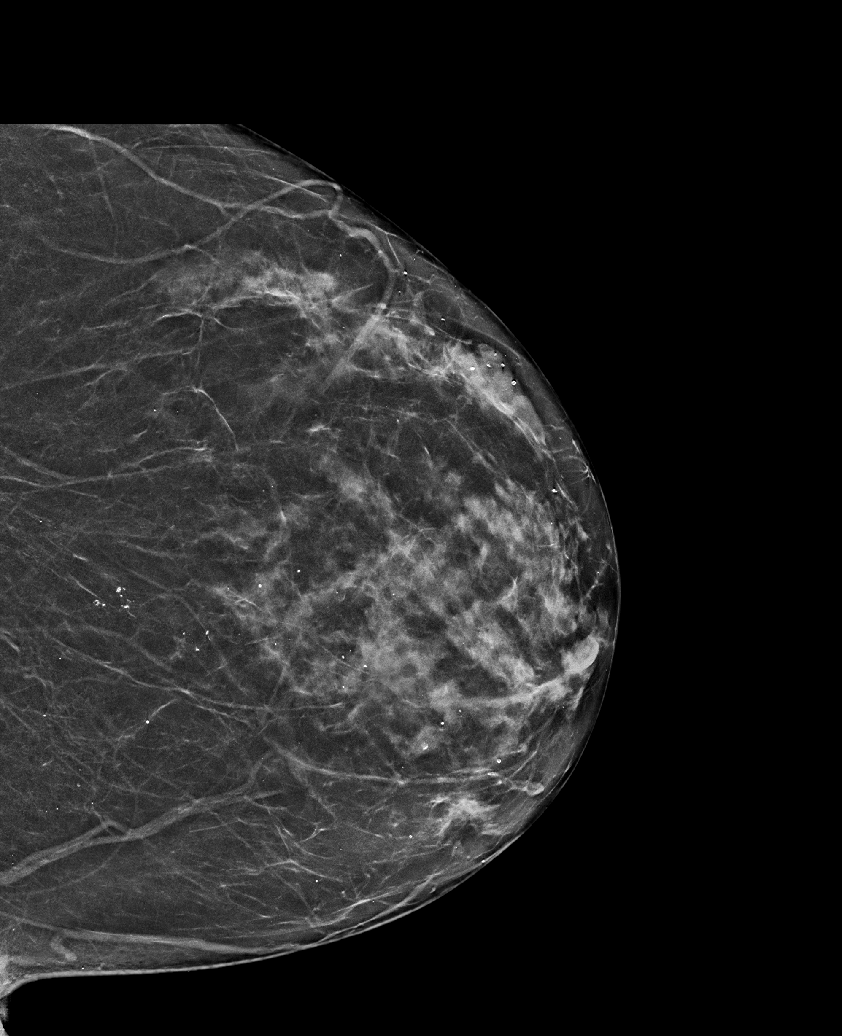

[L MLO synth-2D]
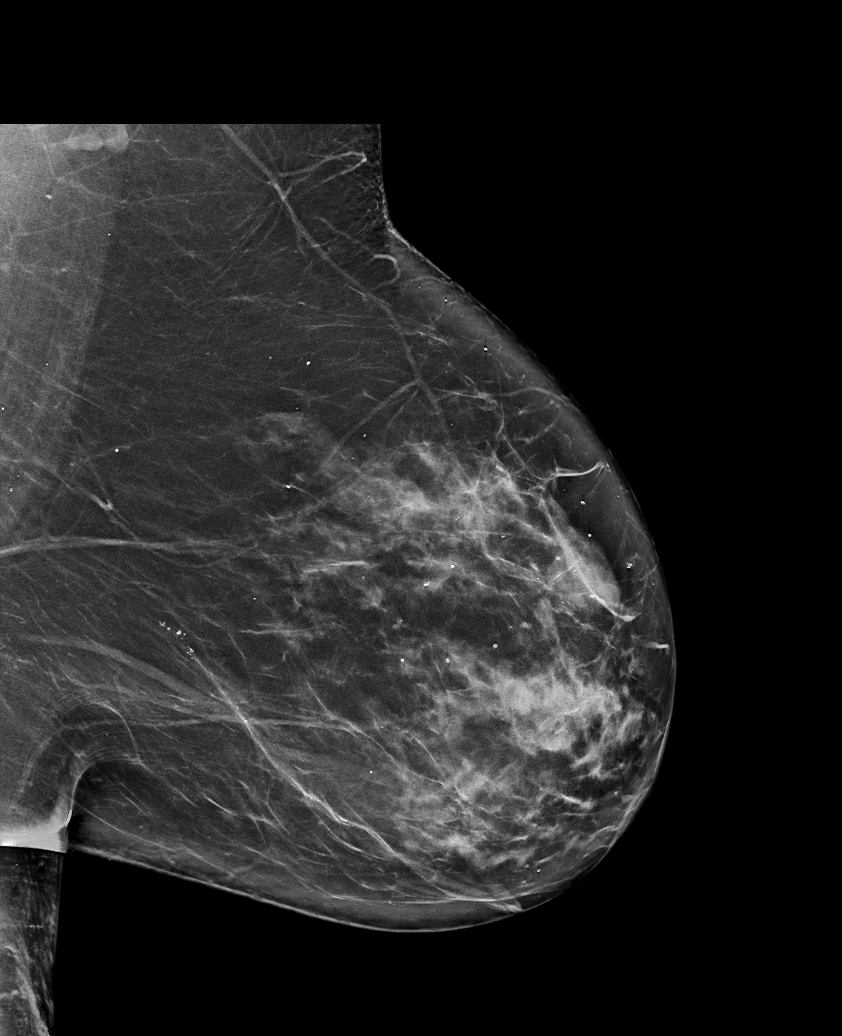

[R CC synth-2D]
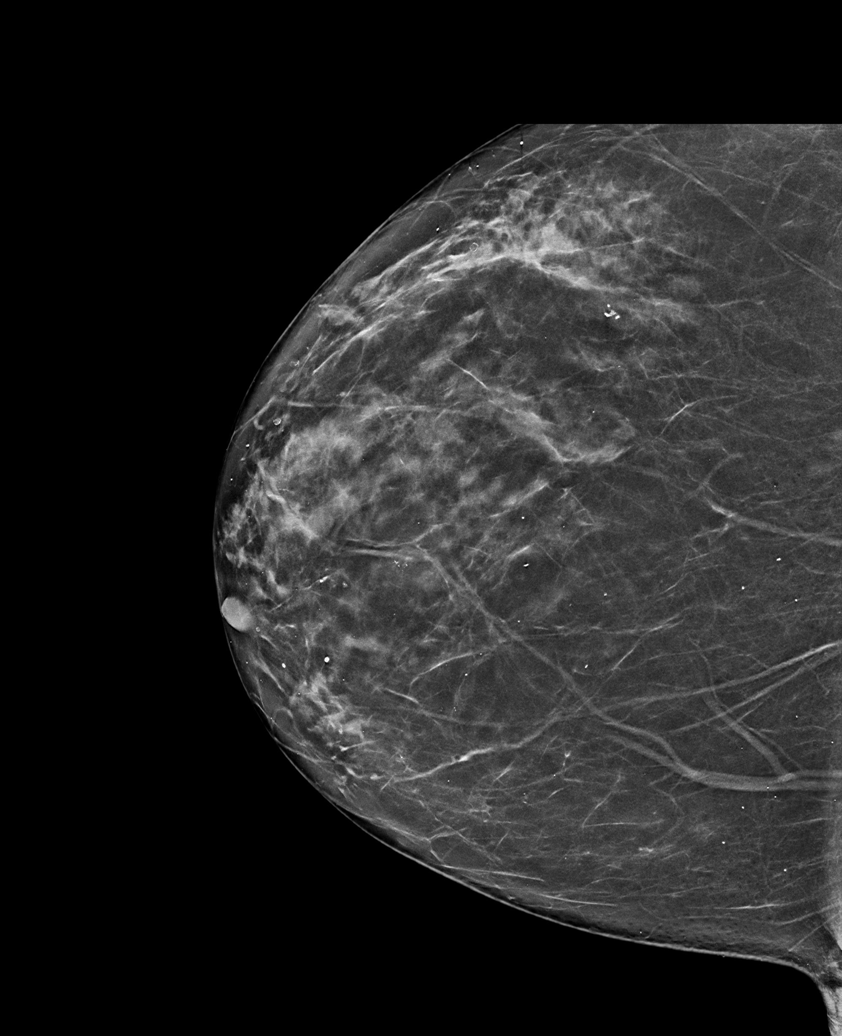

[R MLO synth-2D]
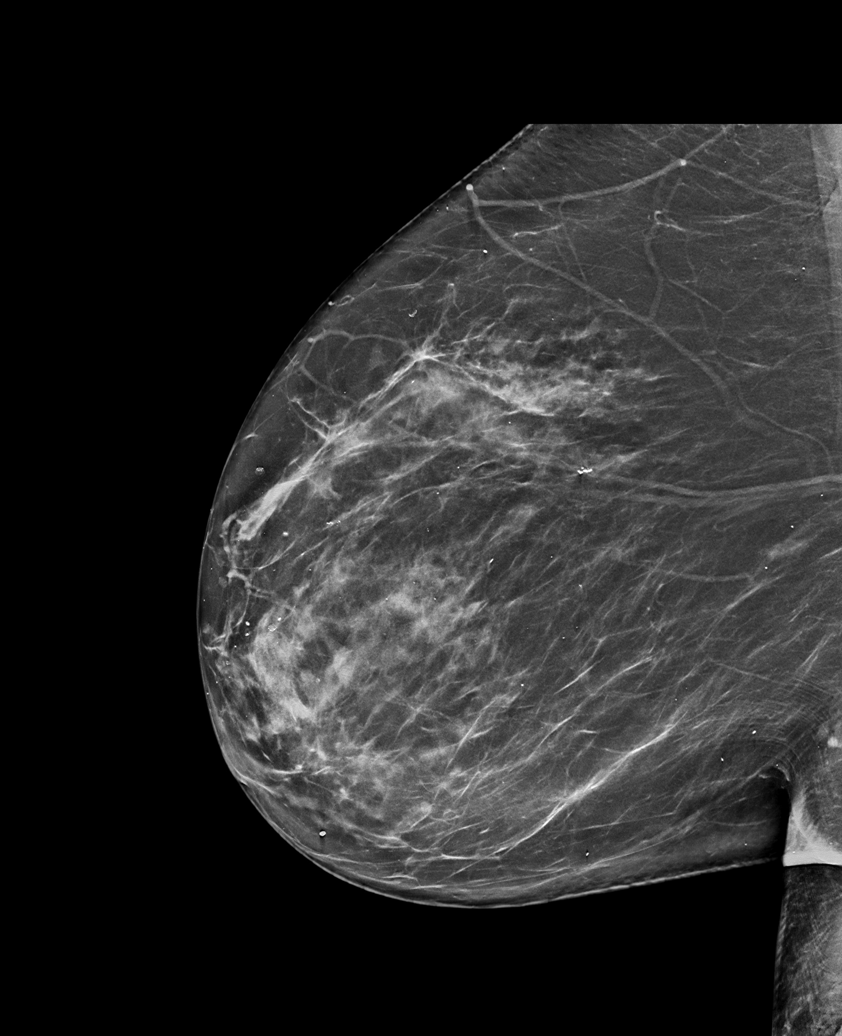

[L MLO tomo · tomo slice 44/87.0]
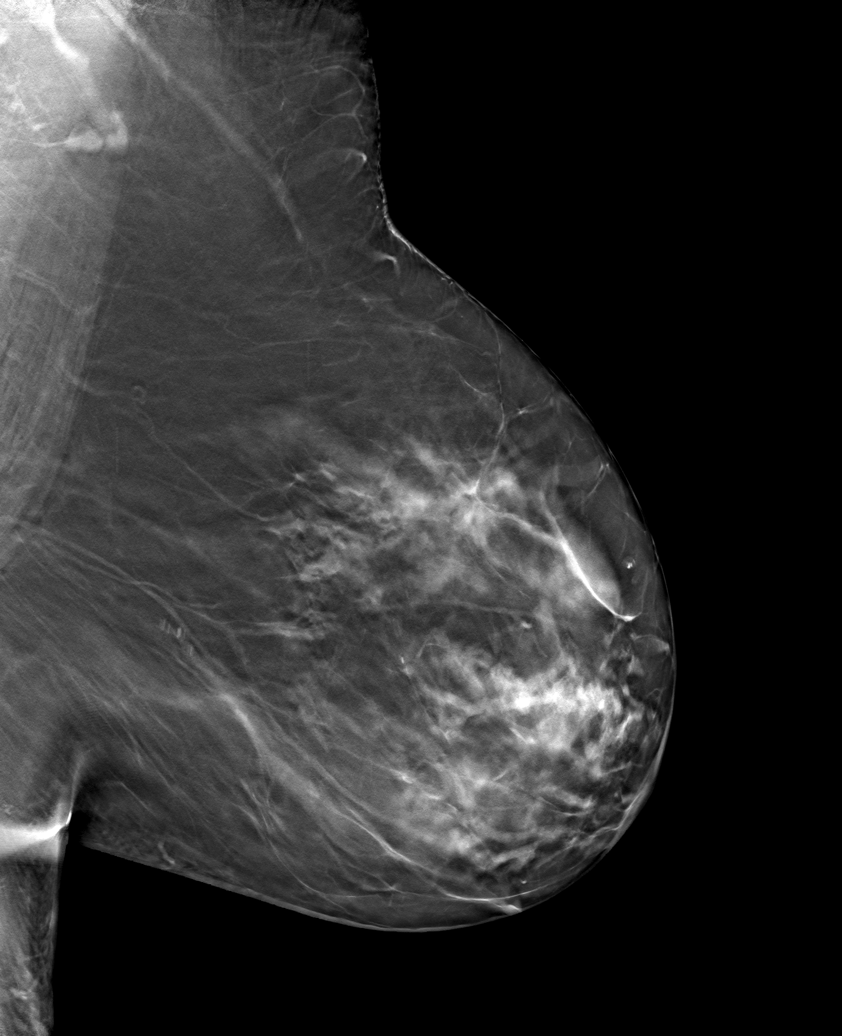

[6 of 30 positions shown; findings below may reference images not displayed]

ACR Breast Density Category c: The breast tissue is heterogeneously
dense, which may obscure small masses.
FINDINGS: In the left breast, possible distortion warrants further evaluation.
In the right breast, no findings suspicious for malignancy. Images
were processed with CAD.
IMPRESSION: Further evaluation is suggested for possible distortion in the left
breast.

RECOMMENDATION:
Diagnostic mammogram and possibly ultrasound of the left breast.
(Code:[id number]-G-HHO)

The patient will be contacted regarding the findings, and additional
imaging will be scheduled.

BI-RADS CATEGORY  0: Incomplete. Need additional imaging evaluation
and/or prior mammograms for comparison.

## 2020-02-02 ENCOUNTER — Ambulatory Visit: Payer: Self-pay

## 2020-02-09 ENCOUNTER — Other Ambulatory Visit: Payer: Self-pay

## 2020-02-09 ENCOUNTER — Encounter: Payer: PPO | Admitting: Family Medicine

## 2020-02-09 ENCOUNTER — Encounter: Payer: Self-pay | Admitting: Physician Assistant

## 2020-02-09 ENCOUNTER — Ambulatory Visit (INDEPENDENT_AMBULATORY_CARE_PROVIDER_SITE_OTHER): Payer: PPO | Admitting: Physician Assistant

## 2020-02-09 VITALS — BP 129/54 | HR 73 | Temp 98.9°F | Resp 18 | Ht 63.0 in | Wt 245.0 lb

## 2020-02-09 DIAGNOSIS — Z6841 Body Mass Index (BMI) 40.0 and over, adult: Secondary | ICD-10-CM | POA: Diagnosis not present

## 2020-02-09 DIAGNOSIS — R7303 Prediabetes: Secondary | ICD-10-CM | POA: Diagnosis not present

## 2020-02-09 DIAGNOSIS — E7800 Pure hypercholesterolemia, unspecified: Secondary | ICD-10-CM | POA: Diagnosis not present

## 2020-02-09 DIAGNOSIS — Z0000 Encounter for general adult medical examination without abnormal findings: Secondary | ICD-10-CM

## 2020-02-09 NOTE — Progress Notes (Signed)
I,Amanda Carpenter,acting as a scribe for [employer], PA-C.,have documented all relevant documentation on the behalf of [name], PA-C,as directed by  [name], PA-C while in the presence of [name], PA-C.   Annual Wellness Visit     Patient: Amanda Carpenter, Female    DOB: Aug 01, 1945, 74 y.o.   MRN: [id number] Visit Date: 02/09/2020  Today's Provider: [name], PA-C   No chief complaint on file.  Subjective    Amanda Carpenter is a 74 y.o. female who presents today for her Annual Wellness Visit. She reports consuming a general diet. The patient does not participate in regular exercise at present. She generally feels well. She reports sleeping fairly well. She does not have additional problems to discuss today.   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/02/2014   Avitaminosis D 10/02/2014   H/O total knee replacement 05/17/2014   OA (osteoarthritis) of knee 04/02/2014   Borderline diabetes 08/14/2009   Past Medical History:  Diagnosis Date   Allergy    Arthritis    OA   Cancer ([place]) 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 Take with a full glass of water on an empty stomach.   calcium citrate  (CALCITRATE - DOSED IN MG ELEMENTAL CALCIUM) 950 MG tablet Take 200 mg of elemental calcium by mouth daily.   esomeprazole (NEXIUM) 20 MG capsule Take 1 capsule (20 mg total) by mouth daily at 12 noon.   UNABLE TO FIND Med Name: Sulfur Crystals- 1.5 teaspoons in the morning   Vitamin D, Ergocalciferol, 2000 units CAPS Take 2,000 Units by mouth daily.   ketoconazole (NIZORAL) 2 % cream Apply 1 application topically daily. To affected areas.   oxybutynin (DITROPAN-XL) 5 MG 24 hr tablet Take 1 tablet (5 mg total) by mouth at bedtime.   triamcinolone ointment (KENALOG) 0.5 % Apply 1 application topically 2 (two) times daily.   No facility-administered medications prior to visit.    Allergies  Allergen Reactions   Motrin [Ibuprofen]     [place]    Patient Care Team: Jerrol Banana., MD as PCP - General (Family Medicine)  Review of Systems  Constitutional: Negative.   HENT: Positive for tinnitus.   Eyes: Negative.   Respiratory: Negative.   Cardiovascular: Positive for leg swelling.  Gastrointestinal: Negative.   Endocrine: Negative.   Genitourinary: Negative.   Musculoskeletal: Negative.   Skin: Negative.   Allergic/Immunologic: Negative.   Neurological: Negative.   Hematological: Negative.   Psychiatric/Behavioral: Negative.     Last CBC Lab Results  Component Value Date   WBC 8.3 02/09/2020   HGB 12.5 02/09/2020   HCT 40.1 02/09/2020   MCV 83 02/09/2020  MCH 25.8 (L) 02/09/2020   RDW 13.6 02/09/2020   PLT 303 29/79/8921   Last metabolic panel Lab Results  Component Value Date   GLUCOSE 83 02/09/2020   NA 138 02/09/2020   K 4.6 02/09/2020   CL 102 02/09/2020   CO2 22 02/09/2020   BUN 13 02/09/2020   CREATININE 0.88 02/09/2020   GFRNONAA 64 02/09/2020   GFRAA 74 02/09/2020   CALCIUM 8.7 02/09/2020   PROT 6.5 02/09/2020   ALBUMIN 4.0 02/09/2020   LABGLOB 2.5 02/09/2020   AGRATIO 1.6 02/09/2020   BILITOT 0.2 02/09/2020   ALKPHOS  115 02/09/2020   AST 19 02/09/2020   ALT 12 02/09/2020   ANIONGAP 6 04/22/2016      Objective    Vitals: BP (!) 129/54 (BP Location: Left Arm, Patient Position: Sitting, Cuff Size: Large)    Pulse 73    Temp 98.9 F (37.2 C) (Oral)    Resp 18    Ht 5\' 3"  (1.6 m)    Wt 245 lb (111.1 kg)    SpO2 98%    BMI 43.40 kg/m  BP Readings from Last 3 Encounters:  02/09/20 (!) 129/54  02/06/19 133/77  01/31/18 (!) 142/60   Wt Readings from Last 3 Encounters:  02/09/20 245 lb (111.1 kg)  02/06/19 232 lb (105.2 kg)  01/31/18 230 lb 6.4 oz (104.5 kg)      Physical Exam Vitals reviewed.  Constitutional:      General: She is not in acute distress.    Appearance: Normal appearance. She is well-developed. She is not ill-appearing or diaphoretic.  HENT:     Head: Normocephalic and atraumatic.     Right Ear: Tympanic membrane, ear canal and external ear normal.     Left Ear: Tympanic membrane, ear canal and external ear normal.     Nose: Nose normal.     Mouth/Throat:     Mouth: Mucous membranes are moist.     Pharynx: Oropharynx is clear. No oropharyngeal exudate.  Eyes:     General: No scleral icterus.       Right eye: No discharge.        Left eye: No discharge.     Extraocular Movements: Extraocular movements intact.     Conjunctiva/sclera: Conjunctivae normal.     Pupils: Pupils are equal, round, and reactive to light.  Neck:     Thyroid: No thyromegaly.     Vascular: No carotid bruit or JVD.     Trachea: No tracheal deviation.  Cardiovascular:     Rate and Rhythm: Normal rate and regular rhythm.     Pulses: Normal pulses.     Heart sounds: Normal heart sounds. No murmur heard.  No friction rub. No gallop.   Pulmonary:     Effort: Pulmonary effort is normal. No respiratory distress.     Breath sounds: Normal breath sounds. No wheezing or rales.  Chest:     Chest wall: No tenderness.  Abdominal:     General: Abdomen is flat. Bowel sounds are normal. There is no distension.      Palpations: Abdomen is soft. There is no mass.     Tenderness: There is no abdominal tenderness. There is no guarding or rebound.  Musculoskeletal:        General: No tenderness. Normal range of motion.     Cervical back: Normal range of motion and neck supple. No tenderness.     Right lower leg: No edema.     Left  lower leg: No edema.  Lymphadenopathy:     Cervical: No cervical adenopathy.  Skin:    General: Skin is warm and dry.     Capillary Refill: Capillary refill takes less than 2 seconds.     Findings: No rash.  Neurological:     General: No focal deficit present.     Mental Status: She is alert and oriented to person, place, and time. Mental status is at baseline.  Psychiatric:        Mood and Affect: Mood normal.        Behavior: Behavior normal.        Thought Content: Thought content normal.        Judgment: Judgment normal.      Most recent functional status assessment: In your present state of health, do you have any difficulty performing the following activities: 02/09/2020  Hearing? N  Vision? N  Difficulty concentrating or making decisions? N  Walking or climbing stairs? N  Dressing or bathing? N  Doing errands, shopping? N  Some recent data might be hidden   Most recent fall risk assessment: Fall Risk  02/09/2020  Falls in the past year? 1  Number falls in past yr: 1  Injury with Fall? 0  Follow up Falls evaluation completed    Most recent depression screenings: PHQ 2/9 Scores 02/09/2020 02/06/2019  PHQ - 2 Score 0 0  PHQ- 9 Score 0 0   Most recent cognitive screening: 6CIT Screen 02/09/2020  What Year? 0 points  What month? 0 points  What time? 0 points  Count back from 20 0 points  Months in reverse 0 points  Repeat phrase 0 points  Total Score 0   Most recent Audit-C alcohol use screening Alcohol Use Disorder Test (AUDIT) 02/09/2020  1. How often do you have a drink containing alcohol? 2  2. How many drinks containing alcohol do you have on  a typical day when you are drinking? 0  3. How often do you have six or more drinks on one occasion? 0  AUDIT-C Score 2  Alcohol Brief Interventions/Follow-up AUDIT Score <7 follow-up not indicated   A score of 3 or more in women, and 4 or more in men indicates increased risk for alcohol abuse, EXCEPT if all of the points are from question 1   No results found for any visits on 02/09/20.  Assessment & Plan     Annual wellness visit done today including the all of the following: Reviewed patient's Family Medical History Reviewed and updated list of patient's medical providers Assessment of cognitive impairment was done Assessed patient's functional ability Established a written schedule for health screening services Health Risk Assessent Completed and Reviewed  Exercise Activities and Dietary recommendations Goals     Exercise 150 minutes per week (moderate activity)     Increase exercise     Recommend increasing exercise to 2 days a week for 1 hour session.        Immunization History  Administered Date(s) Administered   Pneumococcal Conjugate-13 01/09/2015   Pneumococcal Polysaccharide-23 11/02/2012   Td 05/20/2005   Tdap 02/16/2011    Health Maintenance  Topic Date Due   COVID-19 Vaccine (1) Never done   INFLUENZA VACCINE  12/09/2020 (Originally 12/10/2019)   TETANUS/TDAP  02/15/2021   COLONOSCOPY  11/30/2022   DEXA SCAN  Completed   Hepatitis C Screening  Completed   PNA vac Low Risk Adult  Completed     Discussed health benefits of  physical activity, and encouraged her to engage in regular exercise appropriate for her age and condition.    1. Annual physical exam Normal physical exam today. Will check labs as below and f/u pending lab results. If labs are stable and WNL she will not need to have these rechecked for one year at her next annual physical exam. She is to call the office in the meantime if she has any acute issue, questions or  concerns.  2. Borderline diabetes Stable. Diet controlled. Will check labs as below and f/u pending results. - CBC w/Diff/Platelet - Comprehensive Metabolic Panel (CMET) - Lipid Panel With LDL/HDL Ratio - TSH - HgB A1c  3. Hypercholesteremia Diet controlled. Will check labs as below and f/u pending results. - CBC w/Diff/Platelet - Comprehensive Metabolic Panel (CMET) - Lipid Panel With LDL/HDL Ratio - TSH - HgB A1c  4. Class 3 severe obesity due to excess calories with serious comorbidity and body mass index (BMI) of 40.0 to 44.9 in adult [facility]) Counseled patient on healthy lifestyle modifications including dieting and exercise.  - CBC w/Diff/Platelet - Comprehensive Metabolic Panel (CMET) - Lipid Panel With LDL/HDL Ratio - TSH - HgB A1c   No follow-ups on file.     Reynolds Bowl, PA-C, have reviewed all documentation for this visit. The documentation on 02/15/20 for the exam, diagnosis, procedures, and orders are all accurate and complete.   Rubye Beach  [facility] [phone] (phone) [phone] (fax)  [place]

## 2020-02-09 NOTE — Patient Instructions (Signed)
Health Maintenance After Age 75 After age 75, you are at a higher risk for certain long-term diseases and infections as well as injuries from falls. Falls are a major cause of broken bones and head injuries in people who are older than age 75. Getting regular preventive care can help to keep you healthy and well. Preventive care includes getting regular testing and making lifestyle changes as recommended by your health care provider. Talk with your health care provider about:  Which screenings and tests you should have. A screening is a test that checks for a disease when you have no symptoms.  A diet and exercise plan that is right for you. What should I know about screenings and tests to prevent falls? Screening and testing are the best ways to find a health problem early. Early diagnosis and treatment give you the best chance of managing medical conditions that are common after age 75. Certain conditions and lifestyle choices may make you more likely to have a fall. Your health care provider may recommend:  Regular vision checks. Poor vision and conditions such as cataracts can make you more likely to have a fall. If you wear glasses, make sure to get your prescription updated if your vision changes.  Medicine review. Work with your health care provider to regularly review all of the medicines you are taking, including over-the-counter medicines. Ask your health care provider about any side effects that may make you more likely to have a fall. Tell your health care provider if any medicines that you take make you feel dizzy or sleepy.  Osteoporosis screening. Osteoporosis is a condition that causes the bones to get weaker. This can make the bones weak and cause them to break more easily.  Blood pressure screening. Blood pressure changes and medicines to control blood pressure can make you feel dizzy.  Strength and balance checks. Your health care provider may recommend certain tests to check your  strength and balance while standing, walking, or changing positions.  Foot health exam. Foot pain and numbness, as well as not wearing proper footwear, can make you more likely to have a fall.  Depression screening. You may be more likely to have a fall if you have a fear of falling, feel emotionally low, or feel unable to do activities that you used to do.  Alcohol use screening. Using too much alcohol can affect your balance and may make you more likely to have a fall. What actions can I take to lower my risk of falls? General instructions  Talk with your health care provider about your risks for falling. Tell your health care provider if: ? You fall. Be sure to tell your health care provider about all falls, even ones that seem minor. ? You feel dizzy, sleepy, or off-balance.  Take over-the-counter and prescription medicines only as told by your health care provider. These include any supplements.  Eat a healthy diet and maintain a healthy weight. A healthy diet includes low-fat dairy products, low-fat (lean) meats, and fiber from whole grains, beans, and lots of fruits and vegetables. Home safety  Remove any tripping hazards, such as rugs, cords, and clutter.  Install safety equipment such as grab bars in bathrooms and safety rails on stairs.  Keep rooms and walkways well-lit. Activity   Follow a regular exercise program to stay fit. This will help you maintain your balance. Ask your health care provider what types of exercise are appropriate for you.  If you need a cane or   walker, use it as recommended by your health care provider.  Wear supportive shoes that have nonskid soles. Lifestyle  Do not drink alcohol if your health care provider tells you not to drink.  If you drink alcohol, limit how much you have: ? 0-1 drink a day for women. ? 0-2 drinks a day for men.  Be aware of how much alcohol is in your drink. In the [place] one drink equals one typical bottle of beer (12  oz), one-half glass of wine (5 oz), or one shot of hard liquor (1 oz).  Do not use any products that contain nicotine or tobacco, such as cigarettes and e-cigarettes. If you need help quitting, ask your health care provider. Summary  Having a healthy lifestyle and getting preventive care can help to protect your health and wellness after age 75.  Screening and testing are the best way to find a health problem early and help you avoid having a fall. Early diagnosis and treatment give you the best chance for managing medical conditions that are more common for people who are older than age 75.  Falls are a major cause of broken bones and head injuries in people who are older than age 75. Take precautions to prevent a fall at home.  Work with your health care provider to learn what changes you can make to improve your health and wellness and to prevent falls. This information is not intended to replace advice given to you by your health care provider. Make sure you discuss any questions you have with your health care provider. Document Revised: 08/18/2018 Document Reviewed: 03/10/2017 Elsevier Patient Education  2020 Elsevier Inc.  

## 2020-02-10 LAB — COMPREHENSIVE METABOLIC PANEL
ALT: 12 IU/L (ref 0–32)
AST: 19 IU/L (ref 0–40)
Albumin/Globulin Ratio: 1.6 (ref 1.2–2.2)
Albumin: 4 g/dL (ref 3.7–4.7)
Alkaline Phosphatase: 115 IU/L (ref 44–121)
BUN/Creatinine Ratio: 15 (ref 12–28)
BUN: 13 mg/dL (ref 8–27)
Bilirubin Total: 0.2 mg/dL (ref 0.0–1.2)
CO2: 22 mmol/L (ref 20–29)
Calcium: 8.7 mg/dL (ref 8.7–10.3)
Chloride: 102 mmol/L (ref 96–106)
Creatinine, Ser: 0.88 mg/dL (ref 0.57–1.00)
GFR calc Af Amer: 74 mL/min/{1.73_m2} (ref >59)
GFR calc non Af Amer: 64 mL/min/{1.73_m2} (ref >59)
Globulin, Total: 2.5 g/dL (ref 1.5–4.5)
Glucose: 83 mg/dL (ref 65–99)
Potassium: 4.6 mmol/L (ref 3.5–5.2)
Sodium: 138 mmol/L (ref 134–144)
Total Protein: 6.5 g/dL (ref 6.0–8.5)

## 2020-02-10 LAB — CBC WITH DIFFERENTIAL/PLATELET
Basophils Absolute: 0.1 10*3/uL (ref 0.0–0.2)
Basos: 1 %
EOS (ABSOLUTE): 0.2 10*3/uL (ref 0.0–0.4)
Eos: 2 %
Hematocrit: 40.1 % (ref 34.0–46.6)
Hemoglobin: 12.5 g/dL (ref 11.1–15.9)
Immature Grans (Abs): 0 10*3/uL (ref 0.0–0.1)
Immature Granulocytes: 0 %
Lymphocytes Absolute: 2.4 10*3/uL (ref 0.7–3.1)
Lymphs: 29 %
MCH: 25.8 pg — ABNORMAL LOW (ref 26.6–33.0)
MCHC: 31.2 g/dL — ABNORMAL LOW (ref 31.5–35.7)
MCV: 83 fL (ref 79–97)
Monocytes Absolute: 0.7 10*3/uL (ref 0.1–0.9)
Monocytes: 9 %
Neutrophils Absolute: 4.9 10*3/uL (ref 1.4–7.0)
Neutrophils: 59 %
Platelets: 303 10*3/uL (ref 150–450)
RBC: 4.84 x10E6/uL (ref 3.77–5.28)
RDW: 13.6 % (ref 11.7–15.4)
WBC: 8.3 10*3/uL (ref 3.4–10.8)

## 2020-02-10 LAB — LIPID PANEL WITH LDL/HDL RATIO
Cholesterol, Total: 193 mg/dL (ref 100–199)
HDL: 57 mg/dL (ref >39)
LDL Chol Calc (NIH): 105 mg/dL — ABNORMAL HIGH (ref 0–99)
LDL/HDL Ratio: 1.8 ratio (ref 0.0–3.2)
Triglycerides: 182 mg/dL — ABNORMAL HIGH (ref 0–149)
VLDL Cholesterol Cal: 31 mg/dL (ref 5–40)

## 2020-02-10 LAB — TSH: TSH: 2.59 u[IU]/mL (ref 0.450–4.500)

## 2020-02-10 LAB — HEMOGLOBIN A1C
Est. average glucose Bld gHb Est-mCnc: 117 mg/dL
Hgb A1c MFr Bld: 5.7 % — ABNORMAL HIGH (ref 4.8–5.6)

## 2020-02-12 ENCOUNTER — Telehealth: Payer: Self-pay

## 2020-02-12 NOTE — Telephone Encounter (Signed)
Blood count is normal. Kidney and liver function are normal. Sodium, potassium, and calcium are normal. Cholesterol is normal. Thyroid is normal. A1c/sugar has decreased from 6.0 to now 5.7. Keep up the good work.  Written by [name], PA-C on 02/12/2020 1:11 PM EDT Seen by patient Torrie Mayers on 02/12/2020 2:27 PM

## 2020-02-12 NOTE — Telephone Encounter (Signed)
-----   Message from [name], [place] sent at 02/12/2020  1:11 PM EDT ----- Blood count is normal. Kidney and liver function are normal. Sodium, potassium, and calcium are normal. Cholesterol is normal. Thyroid is normal. A1c/sugar has decreased from 6.0 to now 5.7. Keep up the good work.

## 2020-02-15 ENCOUNTER — Encounter: Payer: Self-pay | Admitting: Physician Assistant

## 2020-02-29 ENCOUNTER — Other Ambulatory Visit: Payer: Self-pay | Admitting: Family Medicine

## 2020-02-29 NOTE — Telephone Encounter (Signed)
Requested medication (s) are due for refill today - yes  Requested medication (s) are on the active medication list -yes  Future visit scheduled -yes  Last refill: 02/01/20  Notes to clinic: Request for medication that fails lab/visit protocol- patient has been in for annual exam- probably looking for osteoporosis diagnosis with a visit. Sent for review of continuation of Rx  Requested Prescriptions  Pending Prescriptions Disp Refills   alendronate (FOSAMAX) 70 MG tablet [Pharmacy Med Name: ALENDRONATE SODIUM 70 MG TAB] 4 tablet 11    Sig: TAKE 1 TABLET BY MOUTH ONCE WEEKLY ON AN EMPTY STOMACH BEFORE BREAKFAST. REMAIN UPRIGHT FOR 30 MINUTES & TAKE WITH 8 OUNCES OF WATER      Endocrinology:  Bisphosphonates Failed - 02/29/2020  8:06 AM      Failed - Vitamin D in normal range and within 360 days    No results found for: [id number], [id number], [id number], 25OHVITD3, 25OHVITD2, 25OHVITD3, 25OHVITD2, 25OHVITD1, 25OHVITD2, 25OHVITD3, VD25OH        Failed - Valid encounter within last 12 months    Recent Outpatient Visits           1 year ago Medicare annual wellness visit, subsequent   [facility] [place], Dionne Bucy, MD   2 years ago Medicare annual wellness visit, subsequent   [facility] [place], Dionne Bucy, MD   2 years ago Cough   [facility] Jerrol Banana., MD   3 years ago Healthcare maintenance   [facility] [place], Dionne Bucy, MD   4 years ago Medicare annual wellness visit, subsequent   [facility] [place], Clearnce Sorrel, [place]       Future Appointments             In 11 months Jerrol Banana., MD [facility] [place] in normal range and within 360 days    Calcium  Date Value Ref Range Status  02/09/2020 8.7 8.7 - 10.3 mg/dL Final              Requested Prescriptions  Pending Prescriptions Disp Refills   alendronate (FOSAMAX) 70 MG  tablet [Pharmacy Med Name: ALENDRONATE SODIUM 70 MG TAB] 4 tablet 11    Sig: TAKE 1 TABLET BY MOUTH ONCE WEEKLY ON AN EMPTY STOMACH BEFORE BREAKFAST. REMAIN UPRIGHT FOR 30 MINUTES & TAKE WITH 8 OUNCES OF WATER      Endocrinology:  Bisphosphonates Failed - 02/29/2020  8:06 AM      Failed - Vitamin D in normal range and within 360 days    No results found for: [id number], [id number], [id number], 25OHVITD3, 25OHVITD2, 25OHVITD3, 25OHVITD2, 25OHVITD1, 25OHVITD2, 25OHVITD3, VD25OH        Failed - Valid encounter within last 12 months    Recent Outpatient Visits           1 year ago Medicare annual wellness visit, subsequent   [place], Dionne Bucy, MD   2 years ago Medicare annual wellness visit, subsequent   [facility], Dionne Bucy, MD   2 years ago [place] Jerrol Banana., MD   3 years ago Healthcare maintenance   [facility] [place], Dionne Bucy, MD   4 years ago Medicare annual wellness visit, subsequent   [facility], Clearnce Sorrel, [place]       Future Appointments  In 11 months Jerrol Banana., MD [facility]            Passed - [place] in normal range and within 360 days    Calcium  Date Value Ref Range Status  02/09/2020 8.7 8.7 - 10.3 mg/dL Final

## 2020-04-19 DIAGNOSIS — M65311 Trigger thumb, right thumb: Secondary | ICD-10-CM | POA: Diagnosis not present

## 2020-05-17 ENCOUNTER — Telehealth: Payer: Self-pay | Admitting: Family Medicine

## 2020-05-17 NOTE — Telephone Encounter (Signed)
Copied from [place] [phone]. Topic: Medicare AWV >> May 17, 2020  2:00 PM Cher Nakai R wrote: Reason for CRM:   Left message for patient to call back and schedule Medicare Annual Wellness Visit (AWV) in office.   If not able to come in office, please offer to do virtually.   Last AWV 02/06/2019  Please schedule at anytime with [facility] Health Advisor.  If any questions, please contact me at [phone]

## 2020-05-22 NOTE — Progress Notes (Signed)
Subjective:   Amanda Carpenter is a 75 y.o. female who presents for Medicare Annual (Subsequent) preventive examination.  I connected with [name] today by telephone and verified that I am speaking with the correct person using two identifiers. Location patient: home Location provider: work Persons participating in the virtual visit: patient, provider.   I discussed the limitations, risks, security and privacy concerns of performing an evaluation and management service by telephone and the availability of in person appointments. I also discussed with the patient that there may be a patient responsible charge related to this service. The patient expressed understanding and verbally consented to this telephonic visit.    Interactive audio and video telecommunications were attempted between this provider and patient, however failed, due to patient having technical difficulties OR patient did not have access to video capability.  We continued and completed visit with audio only.   Review of Systems    N/A  Cardiac Risk Factors include: advanced age (>11mn, >>23women)     Objective:    There were no vitals filed for this visit. There is no height or weight on file to calculate BMI.  Advanced Directives 05/23/2020 01/25/2017 01/21/2017 04/20/2016 04/20/2016 04/10/2016 11/05/2015  Does Patient Have a Medical Advance Directive? Yes Yes Yes Yes Yes Yes No  Type of [place]amed[place]of [place] will [place] will Healthcare Power of [place]Living will [place] will -  Does patient want to make changes to medical advance directive? - - - No - Patient declined - No - Patient declined -  Copy of [place] Chart? Yes - validated most recent copy scanned in chart (See row information) - No - copy requested No - copy requested No - copy  requested No - copy requested -  Would patient like information on creating a medical advance directive? - - - - - - No - patient declined information    Current Medications (verified) Outpatient Encounter Medications as of 05/23/2020  Medication Sig  . alendronate (FOSAMAX) 70 MG tablet TAKE 1 TABLET BY MOUTH ONCE WEEKLY ON AN EMPTY STOMACH BEFORE BREAKFAST. REMAIN UPRIGHT FOR 30 MINUTES & TAKE WITH 8 OUNCES OF WATER  . calcium citrate (CALCITRATE - DOSED IN MG ELEMENTAL CALCIUM) 950 MG tablet Take 200 mg of elemental calcium by mouth daily.  .Marland KitchenELDERBERRY PO Take 2 tablets by mouth daily at 6 (six) AM. Gummies  . esomeprazole (NEXIUM) 20 MG capsule Take 1 capsule (20 mg total) by mouth daily at 12 noon.  . fluticasone (FLONASE) 50 MCG/ACT nasal spray Place 1 spray into both nostrils daily. As needed  . OVER THE COUNTER MEDICATION [place] gummies 1 a day  . UNABLE TO FIND Med Name: Sulfur Crystals- 1.5 teaspoons in the morning  . Vitamin D, Ergocalciferol, 2000 units CAPS Take 2,000 Units by mouth daily.   No facility-administered encounter medications on file as of 05/23/2020.    Allergies (verified) Motrin [ibuprofen]   History: Past Medical History:  Diagnosis Date  . Allergy   . Arthritis    OA  . 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 GERD (gastroesophageal reflux disease)   . 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 Swelling of lower extremity    RARELY SWELLS   Past Surgical History:  Procedure Laterality Date  . CHOLECYSTECTOMY  1990  Dr. Bary Castilla  . COLONSCOPY AND ENDOSCOPY  2014  . KNEE ARTHROPLASTY Left 04/2014  . TOTAL KNEE ARTHROPLASTY Right 04/20/2016   Procedure: RIGHT TOTAL KNEE ARTHROPLASTY;  Surgeon: Gaynelle Arabian, MD;  Location: [facility];  Service: Orthopedics;  Laterality: Right;  . 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 Healthy Mother   . Hypertension Mother    . Hyperlipidemia Mother   . Heart attack Father 52  . Breast cancer Sister 54   Social History   Socioeconomic History  . Marital status: Married    Spouse name: Deidre Ala  . Number of children: 2  . Years of education: bachelor's  . Highest education level: Bachelor's degree (e.g., BA, AB, BS)  Occupational History    Employer: [employer]    Comment: 2.5 days a week  Tobacco Use  . Smoking status: Former Smoker    Packs/day: 1.00    Years: 10.00    Pack years: 10.00    Types: Cigarettes    Quit date: 05/11/1985    Years since quitting: 35.0  . Smokeless tobacco: Never Used  Vaping Use  . Vaping Use: Never used  Substance and Sexual Activity  . Alcohol use: Yes    Alcohol/week: 2.0 standard drinks    Types: 2 Glasses of wine per week  . Drug use: No  . Sexual activity: Never  Other Topics Concern  . Not on file  Social History Narrative  . Not on file   Social Determinants of Health   Financial Resource Strain: Low Risk   . Difficulty of Paying Living Expenses: Not hard at all  Food Insecurity: No Food Insecurity  . Worried About Charity fundraiser in the Last Year: Never true  . Ran Out of Food in the Last Year: Never true  Transportation Needs: No Transportation Needs  . Lack of Transportation (Medical): No  . Lack of Transportation (Non-Medical): No  Physical Activity: Inactive  . Days of Exercise per Week: 0 days  . Minutes of Exercise per Session: 0 min  Stress: No Stress Concern Present  . Feeling of Stress : Not at all  Social Connections: Socially Integrated  . Frequency of Communication with Friends and Family: More than three times a week  . Frequency of Social Gatherings with Friends and Family: More than three times a week  . Attends Religious Services: More than 4 times per year  . Active Member of Clubs or Organizations: Yes  . Attends Archivist Meetings: More than 4 times per year  . Marital Status: Married    Tobacco  Counseling Counseling given: Not Answered   Clinical Intake:  Pre-visit preparation completed: Yes  Pain : No/denies pain     Nutritional Risks: None Diabetes: No  How often do you need to have someone help you when you read instructions, pamphlets, or other written materials from your doctor or pharmacy?: 1 - Never  Diabetic? No  Interpreter Needed?: No  Information entered by :: [facility], LPN   Activities of Daily Living In your present state of health, do you have any difficulty performing the following activities: 05/23/2020 02/09/2020  Hearing? N N  Vision? N N  Difficulty concentrating or making decisions? N N  Walking or climbing stairs? N N  Dressing or bathing? N N  Doing errands, shopping? N N  Preparing Food and eating ? N -  Using the Toilet? N -  In the past six months, have you accidently leaked urine? N -  Do you have problems with loss of bowel control? N -  Managing your Medications? N -  Managing your Finances? N -  Housekeeping or managing your Housekeeping? N -  Some recent data might be hidden    Patient Care Team: Jerrol Banana., MD as PCP - General (Family Medicine) Beverly Gust, MD (Otolaryngology) Birder Robson, MD as Referring Physician (Ophthalmology)  Indicate any recent Medical Services you may have received from other than [facility] providers in the past year (date may be approximate).     Assessment:   This is a routine wellness examination for Amanda Carpenter.  Hearing/Vision screen No exam data present  Dietary issues and exercise activities discussed: Current Exercise Habits: The patient does not participate in regular exercise at present, Exercise limited by: None identified  Goals    . Increase exercise     Recommend increasing exercise to 2 days a week for 1 hour session.       Depression Screen PHQ 2/9 Scores 05/23/2020 02/09/2020 02/06/2019 01/31/2018 01/21/2017 01/23/2016 01/09/2015  PHQ - 2 Score 0 0 0 0 0 0 0  PHQ-  9 Score - 0 0 0 - - -    Fall Risk Fall Risk  05/23/2020 02/09/2020 02/06/2019 01/31/2018 01/21/2017  Falls in the past year? 0 1 0 No No  Number falls in past yr: 0 1 0 - -  Injury with Fall? 0 0 0 - -  Follow up - Falls evaluation completed Falls evaluation completed - -    FALL RISK PREVENTION PERTAINING TO THE HOME:  Any stairs in or around the home? No  If so, are there any without handrails? No  Home free of loose throw rugs in walkways, pet beds, electrical cords, etc? Yes  Adequate lighting in your home to reduce risk of falls? Yes   ASSISTIVE DEVICES UTILIZED TO PREVENT FALLS:  Life alert? No  Use of a cane, walker or w/c? No  Grab bars in the bathroom? Yes  Shower chair or bench in shower? Yes  Elevated toilet seat or a handicapped toilet? Yes    Cognitive Function: Normal cognitive status assessed by observation by this Nurse Health Advisor. No abnormalities found.       6CIT Screen 02/09/2020 02/06/2019  What Year? 0 points 0 points  What month? 0 points 0 points  What time? 0 points 0 points  Count back from 20 0 points 0 points  Months in reverse 0 points 0 points  Repeat phrase 0 points 0 points  Total Score 0 0    Immunizations Immunization History  Administered Date(s) Administered  . PFIZER SARS-COV-2 Vaccination 06/28/2019, 07/19/2019, 05/09/2020  . Pneumococcal Conjugate-13 01/09/2015  . Pneumococcal Polysaccharide-23 11/02/2012  . Td 05/20/2005  . Tdap 02/16/2011    TDAP status: Up to date  Flu Vaccine status: Declined, Education has been provided regarding the importance of this vaccine but patient still declined. Advised may receive this vaccine at local pharmacy or Health Dept. Aware to provide a copy of the vaccination record if obtained from local pharmacy or Health Dept. Verbalized acceptance and understanding.  Pneumococcal vaccine status: Up to date  Covid-19 vaccine status: Completed vaccines  Qualifies for Shingles Vaccine? Yes    Zostavax completed No   Shingrix Completed?: No.    Education has been provided regarding the importance of this vaccine. Patient has been advised to call insurance company to determine out of  pocket expense if they have not yet received this vaccine. Advised may also receive vaccine at local pharmacy or Health Dept. Verbalized acceptance and understanding.  Screening Tests Health Maintenance  Topic Date Due  . COLONOSCOPY (Pts 45-67yr Insurance coverage will need to be confirmed)  11/29/2017  . INFLUENZA VACCINE  12/09/2020 (Originally 12/10/2019)  . TETANUS/TDAP  02/15/2021  . DEXA SCAN  03/19/2021  . COVID-19 Vaccine  Completed  . Hepatitis C Screening  Completed  . PNA vac Low Risk Adult  Completed    Health Maintenance  Health Maintenance Due  Topic Date Due  . COLONOSCOPY (Pts 45-441yrInsurance coverage will need to be confirmed)  11/29/2017    Colorectal cancer screening: Currently due. Declined a colonoscopy referral. Cologuard kit ordered today.   Mammogram status: No longer required due to age.  Bone Density status: Completed 03/20/19. Results reflect: Bone density results: OSTEOPOROSIS. Repeat every 2 years.  Lung Cancer Screening: (Low Dose CT Chest recommended if Age 76-80ears, 30 pack-year currently smoking OR have quit w/in 15years.) does not qualify.    Additional Screening:  Hepatitis C Screening: Up to date  Vision Screening: Recommended annual ophthalmology exams for early detection of glaucoma and other disorders of the eye. Is the patient up to date with their annual eye exam?  Yes  Who is the provider or what is the name of the office in which the patient attends annual eye exams? Dr PoGeorge Inaf pt is not established with a provider, would they like to be referred to a provider to establish care? No .   Dental Screening: Recommended annual dental exams for proper oral hygiene  Community Resource Referral / Chronic Care Management: CRR required this  visit?  No   CCM required this visit?  No      Plan:     I have personally reviewed and noted the following in the patient's chart:   . Medical and social history . Use of alcohol, tobacco or illicit drugs  . Current medications and supplements . Functional ability and status . Nutritional status . Physical activity . Advanced directives . List of other physicians . Hospitalizations, surgeries, and ER visits in previous 12 months . Vitals . Screenings to include cognitive, depression, and falls . Referrals and appointments  In addition, I have reviewed and discussed with patient certain preventive protocols, quality metrics, and best practice recommendations. A written personalized care plan for preventive services as well as general preventive health recommendations were provided to patient.     Amanda Carpenter [place] 05/15/07/8119 Nurse Notes: Declined receiving a flu shot this year. Cologuard ordered today.

## 2020-05-23 ENCOUNTER — Ambulatory Visit (INDEPENDENT_AMBULATORY_CARE_PROVIDER_SITE_OTHER): Payer: PPO

## 2020-05-23 ENCOUNTER — Other Ambulatory Visit: Payer: Self-pay

## 2020-05-23 DIAGNOSIS — Z0000 Encounter for general adult medical examination without abnormal findings: Secondary | ICD-10-CM

## 2020-05-23 DIAGNOSIS — Z1211 Encounter for screening for malignant neoplasm of colon: Secondary | ICD-10-CM | POA: Diagnosis not present

## 2020-05-23 NOTE — Patient Instructions (Signed)
Amanda Carpenter , Thank you for taking time to come for your Medicare Wellness Visit. I appreciate your ongoing commitment to your health goals. Please review the following plan we discussed and let me know if I can assist you in the future.   Screening recommendations/referrals: Colonoscopy: Currently due, declined a colonoscopy referral. Cologuard kit ordered today.  Mammogram: No longer required.  Bone Density: Up to date, due 03/2021 Recommended yearly ophthalmology/optometry visit for glaucoma screening and checkup Recommended yearly dental visit for hygiene and checkup  Vaccinations: Influenza vaccine: Currently due, declined receiving.  Pneumococcal vaccine: Completed series Tdap vaccine: Up to date, due 02/2021 Shingles vaccine: Shingrix discussed. Please contact your pharmacy for coverage information.     Advanced directives: POA currently on file.  Conditions/risks identified: Recommend to start back walking 2 days a week for at least 1 hour.   Next appointment: 02/10/21 @ 9:00 AM with Dr Rosanna Randy. Declined scheduling an AWV for 2023 at this time.    Preventive Care 76 Years and Older, Female Preventive care refers to lifestyle choices and visits with your health care provider that can promote health and wellness. What does preventive care include?  A yearly physical exam. This is also called an annual well check.  Dental exams once or twice a year.  Routine eye exams. Ask your health care provider how often you should have your eyes checked.  Personal lifestyle choices, including:  Daily care of your teeth and gums.  Regular physical activity.  Eating a healthy diet.  Avoiding tobacco and drug use.  Limiting alcohol use.  Practicing safe sex.  Taking low-dose aspirin every day.  Taking vitamin and mineral supplements as recommended by your health care provider. What happens during an annual well check? The services and screenings done by your health care  provider during your annual well check will depend on your age, overall health, lifestyle risk factors, and family history of disease. Counseling  Your health care provider may ask you questions about your:  Alcohol use.  Tobacco use.  Drug use.  Emotional well-being.  Home and relationship well-being.  Sexual activity.  Eating habits.  History of falls.  Memory and ability to understand (cognition).  Work and work Statistician.  Reproductive health. Screening  You may have the following tests or measurements:  Height, weight, and BMI.  Blood pressure.  Lipid and cholesterol levels. These may be checked every 5 years, or more frequently if you are over 6 years old.  Skin check.  Lung cancer screening. You may have this screening every year starting at age 69 if you have a 30-pack-year history of smoking and currently smoke or have quit within the past 15 years.  Fecal occult blood test (FOBT) of the stool. You may have this test every year starting at age 55.  Flexible sigmoidoscopy or colonoscopy. You may have a sigmoidoscopy every 5 years or a colonoscopy every 10 years starting at age 9.  Hepatitis C blood test.  Hepatitis B blood test.  Sexually transmitted disease (STD) testing.  Diabetes screening. This is done by checking your blood sugar (glucose) after you have not eaten for a while (fasting). You may have this done every 1-3 years.  Bone density scan. This is done to screen for osteoporosis. You may have this done starting at age 22.  Mammogram. This may be done every 1-2 years. Talk to your health care provider about how often you should have regular mammograms. Talk with your health care provider about  your test results, treatment options, and if necessary, the need for more tests. Vaccines  Your health care provider may recommend certain vaccines, such as:  Influenza vaccine. This is recommended every year.  Tetanus, diphtheria, and acellular  pertussis (Tdap, Td) vaccine. You may need a Td booster every 10 years.  Zoster vaccine. You may need this after age 66.  Pneumococcal 13-valent conjugate (PCV13) vaccine. One dose is recommended after age 50.  Pneumococcal polysaccharide (PPSV23) vaccine. One dose is recommended after age 19. Talk to your health care provider about which screenings and vaccines you need and how often you need them. This information is not intended to replace advice given to you by your health care provider. Make sure you discuss any questions you have with your health care provider. Document Released: 05/24/2015 Document Revised: 01/15/2016 Document Reviewed: 02/26/2015 Elsevier Interactive Patient Education  2017 [place] Prevention in the Home Falls can cause injuries. They can happen to people of all ages. There are many things you can do to make your home safe and to help prevent falls. What can I do on the outside of my home?  Regularly fix the edges of walkways and driveways and fix any cracks.  Remove anything that might make you trip as you walk through a door, such as a raised step or threshold.  Trim any bushes or trees on the path to your home.  Use bright outdoor lighting.  Clear any walking paths of anything that might make someone trip, such as rocks or tools.  Regularly check to see if handrails are loose or broken. Make sure that both sides of any steps have handrails.  Any raised decks and porches should have guardrails on the edges.  Have any leaves, snow, or ice cleared regularly.  Use sand or salt on walking paths during winter.  Clean up any spills in your garage right away. This includes oil or grease spills. What can I do in the bathroom?  Use night lights.  Install grab bars by the toilet and in the tub and shower. Do not use towel bars as grab bars.  Use non-skid mats or decals in the tub or shower.  If you need to sit down in the shower, use a plastic,  non-slip stool.  Keep the floor dry. Clean up any water that spills on the floor as soon as it happens.  Remove soap buildup in the tub or shower regularly.  Attach bath mats securely with double-sided non-slip rug tape.  Do not have throw rugs and other things on the floor that can make you trip. What can I do in the bedroom?  Use night lights.  Make sure that you have a light by your bed that is easy to reach.  Do not use any sheets or blankets that are too big for your bed. They should not hang down onto the floor.  Have a firm chair that has side arms. You can use this for support while you get dressed.  Do not have throw rugs and other things on the floor that can make you trip. What can I do in the kitchen?  Clean up any spills right away.  Avoid walking on wet floors.  Keep items that you use a lot in easy-to-reach places.  If you need to reach something above you, use a strong step stool that has a grab bar.  Keep electrical cords out of the way.  Do not use floor polish or wax  that makes floors slippery. If you must use wax, use non-skid floor wax.  Do not have throw rugs and other things on the floor that can make you trip. What can I do with my stairs?  Do not leave any items on the stairs.  Make sure that there are handrails on both sides of the stairs and use them. Fix handrails that are broken or loose. Make sure that handrails are as long as the stairways.  Check any carpeting to make sure that it is firmly attached to the stairs. Fix any carpet that is loose or worn.  Avoid having throw rugs at the top or bottom of the stairs. If you do have throw rugs, attach them to the floor with carpet tape.  Make sure that you have a light switch at the top of the stairs and the bottom of the stairs. If you do not have them, ask someone to add them for you. What else can I do to help prevent falls?  Wear shoes that:  Do not have high heels.  Have rubber  bottoms.  Are comfortable and fit you well.  Are closed at the toe. Do not wear sandals.  If you use a stepladder:  Make sure that it is fully opened. Do not climb a closed stepladder.  Make sure that both sides of the stepladder are locked into place.  Ask someone to hold it for you, if possible.  Clearly mark and make sure that you can see:  Any grab bars or handrails.  First and last steps.  Where the edge of each step is.  Use tools that help you move around (mobility aids) if they are needed. These include:  Canes.  Walkers.  Scooters.  Crutches.  Turn on the lights when you go into a dark area. Replace any light bulbs as soon as they burn out.  Set up your furniture so you have a clear path. Avoid moving your furniture around.  If any of your floors are uneven, fix them.  If there are any pets around you, be aware of where they are.  Review your medicines with your doctor. Some medicines can make you feel dizzy. This can increase your chance of falling. Ask your doctor what other things that you can do to help prevent falls. This information is not intended to replace advice given to you by your health care provider. Make sure you discuss any questions you have with your health care provider. Document Released: 02/21/2009 Document Revised: 10/03/2015 Document Reviewed: 06/01/2014 Elsevier Interactive Patient Education  2017 [employer].

## 2020-06-03 NOTE — Addendum Note (Signed)
Addended by: Fabio Neighbors A on: 06/03/2020 03:53 PM   Modules accepted: Orders, SmartSet

## 2020-07-10 DIAGNOSIS — M65311 Trigger thumb, right thumb: Secondary | ICD-10-CM | POA: Diagnosis not present

## 2020-07-23 DIAGNOSIS — Z1211 Encounter for screening for malignant neoplasm of colon: Secondary | ICD-10-CM | POA: Diagnosis not present

## 2020-07-29 LAB — COLOGUARD
COLOGUARD: NEGATIVE
Cologuard: NEGATIVE
Cologuard: NEGATIVE

## 2020-07-29 LAB — EXTERNAL GENERIC LAB PROCEDURE: COLOGUARD: NEGATIVE

## 2020-09-25 DIAGNOSIS — H00012 Hordeolum externum right lower eyelid: Secondary | ICD-10-CM | POA: Diagnosis not present

## 2020-10-03 DIAGNOSIS — Z20822 Contact with and (suspected) exposure to covid-19: Secondary | ICD-10-CM | POA: Diagnosis not present

## 2020-10-03 DIAGNOSIS — Z03818 Encounter for observation for suspected exposure to other biological agents ruled out: Secondary | ICD-10-CM | POA: Diagnosis not present

## 2020-10-04 ENCOUNTER — Encounter: Payer: Self-pay | Admitting: Family Medicine

## 2020-10-04 ENCOUNTER — Ambulatory Visit (INDEPENDENT_AMBULATORY_CARE_PROVIDER_SITE_OTHER): Payer: PPO | Admitting: Family Medicine

## 2020-10-04 DIAGNOSIS — U071 COVID-19: Secondary | ICD-10-CM

## 2020-10-04 MED ORDER — NIRMATRELVIR/RITONAVIR (PAXLOVID)TABLET: 3.0000 | ORAL_TABLET | Freq: Two times a day (BID) | ORAL | 0 refills | Status: AC

## 2020-10-04 NOTE — Progress Notes (Signed)
MyChart Video Visit    Virtual Visit via Video Note   This visit type was conducted due to national recommendations for restrictions regarding the COVID-19 Pandemic (e.g. social distancing) in an effort to limit this patient's exposure and mitigate transmission in our community. This patient is at least at moderate risk for complications without adequate follow up. This format is felt to be most appropriate for this patient at this time. Physical exam was limited by quality of the video and audio technology used for the visit.   Patient location: home Provider location: [facility]  I discussed the limitations of evaluation and management by telemedicine and the availability of in person appointments. The patient expressed understanding and agreed to proceed.  Patient: Amanda Carpenter   DOB: 1945/06/21   75 y.o. Female  MRN: [id number] Visit Date: 10/04/2020  Today's healthcare provider: Lelon Huh, MD   Chief Complaint  Patient presents with  . URI   Subjective    URI  This is a new problem. The current episode started in the past 7 days. The problem has been gradually worsening. There has been no fever. Associated symptoms include congestion, coughing, headaches and a sore throat. She has tried decongestant and antihistamine for the symptoms. The treatment provided mild relief.    Patient reports that she tested positive for Covid yesterday with home and a PCR. States cough started on 10-01-2020. Has had three doses, the last dose was in December.     Medications: Outpatient Medications Prior to Visit  Medication Sig  . alendronate (FOSAMAX) 70 MG tablet TAKE 1 TABLET BY MOUTH ONCE WEEKLY ON AN EMPTY STOMACH BEFORE BREAKFAST. REMAIN UPRIGHT FOR 30 MINUTES & TAKE WITH 8 OUNCES OF WATER  . calcium citrate (CALCITRATE - DOSED IN MG ELEMENTAL CALCIUM) 950 MG tablet Take 200 mg of elemental calcium by mouth daily.  Marland Kitchen ELDERBERRY PO Take 2 tablets by mouth daily at 6 (six) AM. Gummies  .  esomeprazole (NEXIUM) 20 MG capsule Take 1 capsule (20 mg total) by mouth daily at 12 noon.  . fluticasone (FLONASE) 50 MCG/ACT nasal spray Place 1 spray into both nostrils daily. As needed  . OVER THE COUNTER MEDICATION [place] gummies 1 a day  . UNABLE TO FIND Med Name: Sulfur Crystals- 1.5 teaspoons in the morning  . Vitamin D, Ergocalciferol, 2000 units CAPS Take 2,000 Units by mouth daily.   No facility-administered medications prior to visit.    Review of Systems  HENT: Positive for congestion and sore throat.   Respiratory: Positive for cough.   Neurological: Positive for headaches.      Objective    There were no vitals taken for this visit.   Physical Exam   Awake, alert, oriented x 3. In no apparent distress   Assessment & Plan     1. COVID-19 Symptomatic for three days. Risk factors include age, history of chronic bronchitis and not having had second covid booster.   - nirmatrelvir/ritonavir EUA (PAXLOVID) TABS; Take 3 tablets by mouth 2 (two) times daily for 5 days. Patient GFR is 67.Take nirmatrelvir (150 mg) two tablets twice daily for 5 days and ritonavir (100 mg) one tablet twice daily for 5 days.  Dispense: 30 tablet; Refill: 0   Call if symptoms change or if not rapidly improving of if [facility] is not able to dispense prescription today.      I discussed the assessment and treatment plan with the patient. The patient was provided an opportunity to  ask questions and all were answered. The patient agreed with the plan and demonstrated an understanding of the instructions.   The patient was advised to call back or seek an in-person evaluation if the symptoms worsen or if the condition fails to improve as anticipated.  I provided 11 minutes of non-face-to-face time during this encounter.  The entirety of the information documented in the History of Present Illness, Review of Systems and Physical Exam were personally obtained by me. Portions of this information  were initially documented by the CMA and reviewed by me for thoroughness and accuracy.     Lelon Huh, MD [facility] [phone] (phone) [phone] (fax)  [place]

## 2020-10-08 ENCOUNTER — Ambulatory Visit: Payer: Self-pay | Admitting: *Deleted

## 2020-10-08 NOTE — Telephone Encounter (Signed)
Per initial encounter, "Patient called in about advice, she states she tested positive for Covid on Wednesday 05/25 and has already started her quarantining, and now this past Sunday 05/29 her mother and husband who she lives with has now tested positive, she wants to know should she still continue her quarantine from Wednesday, or restart from Sunday"; contacted pt to discuss symptoms; the pt says she tested positive on a home test and drive thru on [id number]; also see visit encounter dated 10/04/20; recommendations made per nurse triage protocol; she verbalized understanding;te pt is seen by Dr Miguel Aschoff a [facility]; will route to office for notification of encounter.  Reason for Disposition . [1] [id number] diagnosed by positive lab test (e.g., PCR, rapid self-test kit) AND [2] mild symptoms (e.g., cough, fever, others) AND [3] no complications or SOB . General information question, no triage required and triager able to answer question  Answer Assessment - Initial Assessment Questions 1. REASON FOR CALL or QUESTION: "What is your reason for calling today?" or "How can I best help you?" or "What question do you have that I can help answer?"     COVID 1uarantine questions  Protocols used: CORONAVIRUS (COVID-19) DIAGNOSED OR SUSPECTED-A-AH, INFORMATION ONLY CALL - NO TRIAGE-A-AH

## 2020-10-10 ENCOUNTER — Telehealth: Payer: Self-pay

## 2020-10-10 NOTE — Telephone Encounter (Signed)
I connected by phone with Torrie Mayers and/or patient's caregiver on 10/10/2020 at 12:36 PM to discuss the potential vaccination through our Homebound vaccination initiative.   Prevaccination Checklist for COVID-19 Vaccines  1.  Are you feeling sick today? no  2.  Have you ever received a dose of a COVID-19 vaccine?  yes      If yes, which one? Pfizer   How many dose of Covid-19 vaccine have your received and dates ? 3, 06/28/2019, 07/19/2019, 05/09/2020   Check all that apply: I live in a long-term care setting. no  I have been diagnosed with a medical condition(s). Please list: _______________________ (pertinent to homebound status)  I am a first responder. no  I work in a long-term care facility, correctional facility, hospital, restaurant, retail setting, school, or other setting with high exposure to the public. no  4. Do you have a health condition or are you undergoing treatment that makes you moderately or severely immunocompromised? (This would include treatment for cancer or HIV, receipt of organ transplant, immunosuppressive therapy or high-dose corticosteroids, CAR-T-cell therapy, hematopoietic cell transplant [HCT], DiGeorge syndrome or Wiskott-Aldrich syndrome)  no  5. Have you received hematopoietic cell transplant (HCT) or CAR-T-cell therapies since receiving COVID-19 vaccine? no  6.  Have you ever had an allergic reaction: (This would include a severe reaction [ e.g., anaphylaxis] that required treatment with epinephrine or EpiPen or that caused you to go to the hospital.  It would also include an allergic reaction that occurred within 4 hours that caused hives, swelling, or respiratory distress, including wheezing.) A.  A previous dose of COVID-19 vaccine. no  B.  A vaccine or injectable therapy that contains multiple components, one of which is a COVID-19 vaccine component, but it is not known which component elicited the immediate reaction. no  C.  Are you allergic to  polyethylene glycol? no  D. Are you allergic to Polysorbate, which is found in some vaccines, film coated tablets and intravenous steroids?  no   7.  Have you ever had an allergic reaction to another vaccine (other than COVID-19 vaccine) or an injectable medication? (This would include a severe reaction [ e.g., anaphylaxis] that required treatment with epinephrine or EpiPen or that caused you to go to the hospital.  It would also include an allergic reaction that occurred within 4 hours that caused hives, swelling, or respiratory distress, including wheezing.)  no   8.  Have you ever had a severe allergic reaction (e.g., anaphylaxis) to something other than a component of the COVID-19 vaccine, or any vaccine or injectable medication?  This would include food, pet, venom, environmental, or oral medication allergies.  no   Check all that apply to you:  Am a female between ages 24 and 56 years old  no  Women 68 through 76 years of age can receive any FDA-authorized or -approved COVID-19 vaccine. However, they should be informed of the rare but increased risk of thrombosis with thrombocytopenia syndrome (TTS) after receipt of the [employer] Vaccine and the availability of other FDA-authorized and -approved COVID-19 vaccines. People who had TTS after a first dose of Janssen vaccine should not receive a subsequent dose of Janssen product    Am a female between ages 75 and 82 years old  no Males 5 through 76 years of age may receive the correct formulation of Pfizer-BioNTech COVID-19 vaccine. Males 18 and older can receive any FDA-authorized or -approved vaccine. However, people receiving an mRNA COVID-19 vaccine, especially males  75 through 76 years of age and their parents/legal representative (when relevant), should be informed of the risk of developing myocarditis (an inflammation of the heart muscle) or pericarditis (inflammation of the lining around the heart) after receipt of an mRNA vaccine. The  risk of developing either myocarditis or pericarditis after vaccination is low, and lower than the risk of myocarditis associated with SARS-CoV-2 infection in adolescents and adults. Vaccine recipients should be counseled about the need to seek care if symptoms of myocarditis or pericarditis develop after vaccination     Have a history of myocarditis or pericarditis  no Myocarditis or pericarditis after receipt of the first dose of an mRNA COVID-19 vaccine series but before administration of the second dose  Experts advise that people who develop myocarditis or pericarditis after a dose of an mRNA COVID-19 vaccine not receive a subsequent dose of any COVID-19 vaccine, until additional safety data are available.  Administration of a subsequent dose of COVID-19 vaccine before safety data are available can be considered in certain circumstances after the episode of myocarditis or pericarditis has completely resolved. Until additional data are available, some experts recommend a Alphonsa Overall COVID-19 vaccine be considered instead of an mRNA COVID-19 vaccine. Decisions about proceeding with a subsequent dose should include a conversation between the patient, their parent/legal representative (when relevant), and their clinical team, which may include a cardiologist.    Have been treated with monoclonal antibodies or convalescent serum to prevent or treat COVID-19  no; pt reports she and her husband are just now getting over [place] and are on Paxlovid. Vaccination should be offered to people regardless of history of prior symptomatic or asymptomatic SARS-CoV-2 infection. There is no recommended minimal interval between infection and vaccination.  However, vaccination should be deferred if a patient received monoclonal antibodies or convalescent serum as treatment for COVID-19 or for post-exposure prophylaxis. This is a precautionary measure until additional information becomes available, to avoid interference of the  antibody treatment with vaccine-induced immune responses.  Defer COVID-19 vaccination for 30 days when a passive antibody product was used for post-exposure prophylaxis.  Defer COVID-19 vaccination for 90 days when a passive antibody product was used to treat COVID-19.     Diagnosed with Multisystem Inflammatory Syndrome (MIS-C or MIS-A) after a COVID-19 infection  no It is unknown if people with a history of MIS-C or MIS-A are at risk for a dysregulated immune response to COVID-19 vaccination.  People with a history of MIS-C or MIS-A may choose to be vaccinated. Considerations for vaccination may include:   Clinical recovery from MIS-C or MIS-A, including return to normal cardiac function   Personal risk of severe acute COVID-19 (e.g., age, underlying conditions)   High or substantial community transmission of SARS-CoV-2 and personal increased risk of reinfection.   Timing of any immunomodulatory therapies (general best practice guidelines for immunization can be consulted for more information [email or web address])   It has been 90 days or more since their diagnosis of MIS-C   Onset of MIS-C occurred before any COVID-19 vaccination   A conversation between the patient, their guardian(s), and their clinical team or a specialist may assist with COVID-19 vaccination decisions. Healthcare providers and health departments may also request a consultation from the [place] at [email or web address].     Have a bleeding disorder  no Take a blood thinner  no As with all vaccines, any COVID-19 vaccine product may be given to these patients, if a physician familiar with the patient's  bleeding risk determines that the vaccine can be administered intramuscularly with reasonable safety.  ACIP recommends the following technique for intramuscular vaccination in patients with  bleeding disorders or taking blood thinners: a fine-gauge needle (23-gauge or smaller caliber) should be used for the vaccination, followed by firm pressure on the site, without rubbing, for at least 2 minutes.  People who regularly take aspirin or anticoagulants as part of their routine medications do not need to stop these medications prior to receipt of any COVID-19 vaccine.    Have a history of heparin-induced thrombocytopenia (HIT)  no Although the etiology of TTS associated with the Alphonsa Overall COVID-19 vaccine is unclear, it appears to be similar to another rare immune-mediated syndrome, heparin-induced thrombocytopenia (HIT). People with a history of an episode of an immune-mediated syndrome characterized by thrombosis and thrombocytopenia, such as HIT, should be offered a currently FDA-approved or FDA-authorized mRNA COVID-19 vaccine if it has been ?90 days since their TTS resolved. After 90 days, patients may be vaccinated with any currently FDA-approved or FDA-authorized COVID-19 vaccine, including Janssen COVID-19 Vaccine. However, people who developed TTS after their initial Alphonsa Overall vaccine should not receive a Janssen booster dose.  Experts believe the following factors do not make people more susceptible to TTS after receipt of the [employer]. People with these conditions can be vaccinated with any FDA-authorized or - approved COVID-19 vaccine, including the [employer] COVID-19 Vaccine:   A prior history of venous thromboembolism   Risk factors for venous thromboembolism (e.g., inherited or acquired thrombophilia including Factor V Leiden; prothrombin gene 20210A mutation; antiphospholipid syndrome; protein C, protein S or antithrombin deficiency   A prior history of other types of thromboses not associated with thrombocytopenia   Pregnancy, post-partum status, or receipt of hormonal contraceptives (e.g., combined oral contraceptives, patch, ring)   Additional recipient education  materials can be found at [email or web address]-[email or web address]/ vaccines/safety/JJUpdate.html.    Am currently pregnant or breastfeeding  no Vaccination is recommended for all people aged 41 years and older, including people that are:   Pregnant   Breastfeeding   Trying to get pregnant now or who might become pregnant in the future   Pregnant, breastfeeding, and post-partum people 65 through 76 years of age should be aware of the rare risk of TTS after receipt of the Alphonsa Overall COVID-19 Vaccine and the availability of other FDA-authorized or -approved COVID-19 vaccines (i.e., mRNA vaccines).    Have received dermal fillers  no FDA-authorized or -approved COVID-19 vaccines can be administered to people who have received injectable dermal fillers who have no contraindications for vaccination.  Infrequently, these people might experience temporary swelling at or near the site of filler injection (usually the face or lips) following administration of a dose of an mRNA COVID-19 vaccine. These people should be advised to contact their healthcare provider if swelling develops at or near the site of dermal filler following vaccination.     Have a history of Guillain-Barr Syndrome (GBS)  no People with a history of GBS can receive any FDA-authorized or -approved COVID-19 vaccine. However, given the possible association between the [employer] and an increased risk of GBS, a patient with a history of GBS and their clinical team should discuss the availability of mRNA vaccines to offer protection against COVID-19. The highest risk has been observed in men aged 51-64 years with symptoms of GBS beginning within 42 days after Alphonsa Overall COVID-19 vaccination.  People who had GBS after receiving Janssen vaccine should be made aware of the option  to receive an mRNA COVID-19 vaccine booster at least 2 months (8 weeks) after the Janssen dose. However, Alphonsa Overall vaccine may be used as a booster, particularly if GBS  occurred more than 42 days after vaccination or was related to a non-vaccine factor. Prior to booster vaccination, a conversation between the patient and their clinical team may assist with decisions about use of a COVID-19 booster dose, including the timing of administration     Postvaccination Observation Times for People without Contraindications to Covid 19 Vaccination.  30 minutes:  People with a history of: A contraindication to another type of COVID-19 vaccine product (i.e., mRNA or viral vector COVID-19 vaccines)   Immediate (within 4 hours of exposure) non-severe allergic reaction to a COVID-19 vaccine or injectable therapies   Anaphylaxis due to any cause   Immediate allergic reaction of any severity to a non-COVID-19 vaccine   15 minutes: All other people  This patient is a 75 y.o. female that meets the FDA criteria to receive homebound vaccination. Patient or parent/caregiver understands they have the option to accept or refuse homebound vaccination.  Patient passed the pre-screening checklist and would like to proceed with homebound vaccination.  Based on questionnaire above, I recommend the patient be observed for 15 minutes.  There are an estimated #3 other household members/caregivers who are also interested in receiving the vaccine.    The patient has been confirmed homebound and eligible for homebound vaccination with the considerations outlined above. I will send the patient's information to our scheduling team who will reach out to schedule the patient and potential caregiver/family members for homebound vaccination.    Dan Humphreys 10/10/2020 12:36 PM

## 2020-11-06 ENCOUNTER — Ambulatory Visit: Payer: PPO | Attending: Critical Care Medicine

## 2020-11-06 DIAGNOSIS — Z23 Encounter for immunization: Secondary | ICD-10-CM

## 2020-11-06 NOTE — Progress Notes (Signed)
   Covid-19 Vaccination Clinic  Name:  Amanda Carpenter    MRN: [id number] DOB: October 09, 1945  11/06/2020  [name] was observed post Covid-19 immunization for 15 minutes without incident. She was provided with Vaccine Information Sheet and instruction to access the V-Safe system.   [name] was instructed to call 911 with any severe reactions post vaccine: Difficulty breathing  Swelling of face and throat  A fast heartbeat  A bad rash all over body  Dizziness and weakness   Immunizations Administered     Name Date Dose VIS Date Route   Moderna Covid-19 Booster Vaccine 11/06/2020 11:11 AM 0.25 mL 02/28/2020 Intramuscular   Manufacturer: Moderna   Lot: [id number]   [place]: 84039-795-36

## 2020-12-23 ENCOUNTER — Other Ambulatory Visit: Payer: Self-pay | Admitting: Family Medicine

## 2020-12-23 MED ORDER — OMEPRAZOLE 40 MG PO CPDR: 40.0000 mg | DELAYED_RELEASE_CAPSULE | Freq: Every day | ORAL | 3 refills | Status: DC

## 2021-01-15 DIAGNOSIS — B353 Tinea pedis: Secondary | ICD-10-CM | POA: Diagnosis not present

## 2021-01-15 DIAGNOSIS — L821 Other seborrheic keratosis: Secondary | ICD-10-CM | POA: Diagnosis not present

## 2021-01-15 DIAGNOSIS — D485 Neoplasm of uncertain behavior of skin: Secondary | ICD-10-CM | POA: Diagnosis not present

## 2021-02-10 ENCOUNTER — Telehealth: Payer: Self-pay

## 2021-02-10 ENCOUNTER — Encounter: Payer: PPO | Admitting: Family Medicine

## 2021-02-10 NOTE — Telephone Encounter (Signed)
[place] faxed refill request for the following medications:  alendronate (FOSAMAX) 70 MG tablet  Last Rx: 03/01/20 LOV: 10/04/20 Please advise. Thanks TNP

## 2021-02-11 MED ORDER — ALENDRONATE SODIUM 70 MG PO TABS: ORAL_TABLET | ORAL | 11 refills | Status: DC

## 2021-02-11 NOTE — Telephone Encounter (Signed)
Rx was sent to pharmacy. 

## 2021-03-11 ENCOUNTER — Other Ambulatory Visit: Payer: Self-pay

## 2021-03-11 ENCOUNTER — Ambulatory Visit (INDEPENDENT_AMBULATORY_CARE_PROVIDER_SITE_OTHER): Payer: PPO | Admitting: Family Medicine

## 2021-03-11 VITALS — BP 149/58 | HR 71 | Temp 98.6°F | Ht 63.0 in | Wt 249.0 lb

## 2021-03-11 DIAGNOSIS — R7303 Prediabetes: Secondary | ICD-10-CM

## 2021-03-11 DIAGNOSIS — M85851 Other specified disorders of bone density and structure, right thigh: Secondary | ICD-10-CM

## 2021-03-11 DIAGNOSIS — Z6841 Body Mass Index (BMI) 40.0 and over, adult: Secondary | ICD-10-CM

## 2021-03-11 DIAGNOSIS — E7800 Pure hypercholesterolemia, unspecified: Secondary | ICD-10-CM

## 2021-03-11 DIAGNOSIS — K219 Gastro-esophageal reflux disease without esophagitis: Secondary | ICD-10-CM

## 2021-03-11 DIAGNOSIS — Z96659 Presence of unspecified artificial knee joint: Secondary | ICD-10-CM

## 2021-03-11 DIAGNOSIS — M85852 Other specified disorders of bone density and structure, left thigh: Secondary | ICD-10-CM | POA: Diagnosis not present

## 2021-03-11 DIAGNOSIS — Z0000 Encounter for general adult medical examination without abnormal findings: Secondary | ICD-10-CM | POA: Diagnosis not present

## 2021-03-11 DIAGNOSIS — Z23 Encounter for immunization: Secondary | ICD-10-CM | POA: Diagnosis not present

## 2021-03-11 NOTE — Progress Notes (Signed)
Complete physical exam   Patient: Amanda Carpenter   DOB: [birth year]   75 y.o. Female  MRN: [id number] Visit Date: 03/11/2021  Today's healthcare provider: Wilhemena Durie, MD   No chief complaint on file.  Subjective    Amanda Carpenter is a 75 y.o. female who presents today for a complete physical exam.  She reports consuming a general diet.  She generally feels well. She reports sleeping well. She does not have additional problems to discuss today.  HPI  Nocturia times 3 with no other issues. Patient has AWV with [facility] on 05/23/2020.  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   Social History   Socioeconomic History   Marital status: Married    Spouse name: Deidre Ala   Number of children: 2   Years of education: bachelor's   Highest education level: Bachelor's degree (e.g., BA, AB, BS)  Occupational History    Employer: [employer]    Comment: 2.5 days a week  Tobacco Use   Smoking status: Former    Packs/day: 1.00    Years: 10.00    Pack years: 10.00    Types: Cigarettes    Quit date: 05/11/1985    Years since quitting: 35.8   Smokeless tobacco: Never  Vaping Use   Vaping Use: Never used  Substance and Sexual Activity   Alcohol use: Yes    Alcohol/week: 2.0 standard drinks    Types: 2 Glasses of wine per week   Drug use: No   Sexual activity:  Never  Other Topics Concern   Not on file  Social History Narrative   Not on file   Social Determinants of Health   Financial Resource Strain: Low Risk    Difficulty of Paying Living Expenses: Not hard at all  Food Insecurity: No Food Insecurity   Worried About Charity fundraiser in the Last Year: Never true   [place] in the Last Year: Never true  Transportation Needs: No Transportation Needs   Lack of Transportation (Medical): No   Lack of Transportation (Non-Medical): No  Physical Activity: Inactive   Days of Exercise per Week: 0 days   Minutes of Exercise per Session: 0 min  Stress: No Stress Concern Present   Feeling of Stress : Not at all  Social Connections: Socially Integrated   Frequency of Communication with Friends and Family: More than three times a week   Frequency of Social Gatherings with Friends and Family: More than three times a week   Attends Religious Services: More than 4 times per year   Active Member of [employer] or Organizations: Yes   Attends Music therapist: More than 4 times per year   Marital Status: Married  Human resources officer Violence:  Not At Risk   Fear of Current or Ex-Partner: No   Emotionally Abused: No   Physically Abused: No   Sexually Abused: No   Family Status  Relation Name Status   Mother  Alive   Father  Deceased at age 52       MI; CAD   Sister  Deceased at age 61       Breast Cancer (possible reaction to chemotherapy)   Family History  Problem Relation Age of Onset   Healthy Mother    Hypertension Mother    Hyperlipidemia Mother    Heart attack Father 82   Breast cancer Sister 86   Allergies  Allergen Reactions   Motrin [Ibuprofen]     Britton    Patient Care Team: Jerrol Banana., MD as PCP - General (Family Medicine) Beverly Gust, MD (Otolaryngology) Birder Robson, MD as Referring Physician (Ophthalmology)   Medications: Outpatient Medications Prior to Visit   Medication Sig   calcium citrate (CALCITRATE - DOSED IN MG ELEMENTAL CALCIUM) 950 MG tablet Take 200 mg of elemental calcium by mouth daily.   ELDERBERRY PO Take 2 tablets by mouth daily at 6 (six) AM. Gummies   omeprazole (PRILOSEC) 40 MG capsule Take 1 capsule (40 mg total) by mouth daily.   OVER THE COUNTER MEDICATION [place] gummies 1 a day   UNABLE TO FIND Med Name: Sulfur Crystals- 1.5 teaspoons in the morning   Vitamin D, Ergocalciferol, 2000 units CAPS Take 2,000 Units by mouth daily.   alendronate (FOSAMAX) 70 MG tablet TAKE 1 TABLET BY MOUTH ONCE WEEKLY ON AN EMPTY STOMACH BEFORE BREAKFAST. REMAIN UPRIGHT FOR 30 MINUTES & TAKE WITH 8 OUNCES OF WATER (Patient not taking: Reported on 03/11/2021)   fluticasone (FLONASE) 50 MCG/ACT nasal spray Place 1 spray into both nostrils daily. As needed (Patient not taking: Reported on 03/11/2021)   No facility-administered medications prior to visit.    Review of Systems  Constitutional: Negative.   HENT:  Positive for tinnitus.   Eyes: Negative.   Respiratory: Negative.    Cardiovascular: Negative.   Gastrointestinal: Negative.   Endocrine: Negative.   Genitourinary:  Positive for frequency.  Musculoskeletal: Negative.   Skin: Negative.   Allergic/Immunologic: Negative.   Neurological: Negative.   Hematological: Negative.   Psychiatric/Behavioral: Negative.    All other systems reviewed and are negative.     Objective    BP (!) 149/58 (BP Location: Right Arm, Patient Position: Sitting, Cuff Size: Large)   Pulse 71   Temp 98.6 F (37 C) (Oral)   Ht 5\' 3"  (1.6 m)   Wt 249 lb (112.9 kg)   SpO2 99%   BMI 44.11 kg/m     Physical Exam Constitutional:      Appearance: Normal appearance. She is normal weight.  HENT:     Head: Normocephalic and atraumatic.     Right Ear: Tympanic membrane, ear canal and external ear normal.     Left Ear: Tympanic membrane, ear canal and external ear normal.     Nose: Nose normal.      Mouth/Throat:     Mouth: Mucous membranes are moist.     Pharynx: Oropharynx is clear.  Eyes:     Extraocular Movements: Extraocular movements intact.     Conjunctiva/sclera: Conjunctivae normal.     Pupils: Pupils are equal, round, and reactive to light.  Cardiovascular:     Rate and Rhythm: Normal rate and regular rhythm.     Pulses:  Normal pulses.     Heart sounds: Normal heart sounds.  Pulmonary:     Effort: Pulmonary effort is normal.     Breath sounds: Normal breath sounds.  Abdominal:     General: Abdomen is flat. Bowel sounds are normal.     Palpations: Abdomen is soft.  Musculoskeletal:     Cervical back: Normal range of motion and neck supple.     Comments: Mild lymphedema  Skin:    General: Skin is warm and dry.  Neurological:     General: No focal deficit present.     Mental Status: She is alert and oriented to person, place, and time.  Psychiatric:        Mood and Affect: Mood normal.        Behavior: Behavior normal.        Thought Content: Thought content normal.        Judgment: Judgment normal.      Last depression screening scores PHQ 2/9 Scores 03/11/2021 05/23/2020 02/09/2020  PHQ - 2 Score 0 0 0  PHQ- 9 Score 3 - 0   Last fall risk screening Fall Risk  03/11/2021  Falls in the past year? 0  Number falls in past yr: 0  Injury with Fall? 0  Follow up -   Last Audit-C alcohol use screening Alcohol Use Disorder Test (AUDIT) 03/11/2021  1. How often do you have a drink containing alcohol? 1  2. How many drinks containing alcohol do you have on a typical day when you are drinking? 0  3. How often do you have six or more drinks on one occasion? 0  AUDIT-C Score 1  4. How often during the last year have you found that you were not able to stop drinking once you had started? -  5. How often during the last year have you failed to do what was normally expected from you because of drinking? -  6. How often during the last year have you needed a first drink in  the morning to get yourself going after a heavy drinking session? -  7. How often during the last year have you had a feeling of guilt of remorse after drinking? -  8. How often during the last year have you been unable to remember what happened the night before because you had been drinking? -  9. Have you or someone else been injured as a result of your drinking? -  10. Has a relative or friend or a doctor or another health worker been concerned about your drinking or suggested you cut down? -  Alcohol Use Disorder Identification Test Final Score (AUDIT) -  Alcohol Brief Interventions/Follow-up -   A score of 3 or more in women, and 4 or more in men indicates increased risk for alcohol abuse, EXCEPT if all of the points are from question 1   No results found for any visits on 03/11/21.  Assessment & Plan    Routine Health Maintenance and Physical Exam  Exercise Activities and Dietary recommendations  Goals      Increase exercise     Recommend increasing exercise to 2 days a week for 1 hour session.         Immunization History  Administered Date(s) Administered   Moderna SARS-COV2 Booster Vaccination 11/06/2020   PFIZER(Purple Top)SARS-COV-2 Vaccination 06/28/2019, 07/19/2019, 05/09/2020   Pneumococcal Conjugate-13 01/09/2015   Pneumococcal Polysaccharide-23 11/02/2012   Td 05/20/2005   Tdap 02/16/2011    Health Maintenance  Topic Date Due   Zoster Vaccines- Shingrix (1 of 2) Never done   COVID-19 Vaccine (4 - Booster for Pfizer series) 01/01/2021   TETANUS/TDAP  02/15/2021   INFLUENZA VACCINE  08/08/2021 (Originally 12/09/2020)   DEXA SCAN  03/19/2021   Pneumonia Vaccine 4+ Years old  Completed   Hepatitis C Screening  Completed   HPV VACCINES  Aged Out    Discussed health benefits of physical activity, and encouraged her to engage in regular exercise appropriate for her age and condition.  1. Annual physical exam   2. Hypercholesteremia  - Lipid panel -  TSH - CBC w/Diff/Platelet - Comprehensive Metabolic Panel (CMET) - Hemoglobin A1c  3. Borderline diabetes  - Lipid panel - TSH - CBC w/Diff/Platelet - Comprehensive Metabolic Panel (CMET) - Hemoglobin A1c  4. Class 3 severe obesity due to excess calories with serious comorbidity and body mass index (BMI) of 40.0 to 44.9 in adult (HCC)  - Lipid panel - TSH - CBC w/Diff/Platelet - Comprehensive Metabolic Panel (CMET) - Hemoglobin A1c  5. Need for influenza vaccination   6. Gastroesophageal reflux disease, unspecified whether esophagitis present   7. Osteopenia of necks of both femurs Patient off of Fosamax.  8. History of total knee replacement, unspecified laterality Doing well 9.  Obesity Patient has a BMI of 44 but she is pear-shaped.  Diet and exercise discussed  No follow-ups on file.     I, Wilhemena Durie, MD, have reviewed all documentation for this visit. The documentation on 03/16/21 for the exam, diagnosis, procedures, and orders are all accurate and complete.    Maryclaire Stoecker Cranford Mon, MD  [facility] [phone] (phone) [phone] (fax)  [place]

## 2021-03-12 LAB — CBC WITH DIFFERENTIAL/PLATELET
Basophils Absolute: 0.1 10*3/uL (ref 0.0–0.2)
Basos: 1 %
EOS (ABSOLUTE): 0.2 10*3/uL (ref 0.0–0.4)
Eos: 2 %
Hematocrit: 37.6 % (ref 34.0–46.6)
Hemoglobin: 12.2 g/dL (ref 11.1–15.9)
Immature Grans (Abs): 0 10*3/uL (ref 0.0–0.1)
Immature Granulocytes: 1 %
Lymphocytes Absolute: 2.3 10*3/uL (ref 0.7–3.1)
Lymphs: 27 %
MCH: 26.5 pg — ABNORMAL LOW (ref 26.6–33.0)
MCHC: 32.4 g/dL (ref 31.5–35.7)
MCV: 82 fL (ref 79–97)
Monocytes Absolute: 0.7 10*3/uL (ref 0.1–0.9)
Monocytes: 8 %
Neutrophils Absolute: 5.4 10*3/uL (ref 1.4–7.0)
Neutrophils: 61 %
Platelets: 309 10*3/uL (ref 150–450)
RBC: 4.6 x10E6/uL (ref 3.77–5.28)
RDW: 13.4 % (ref 11.7–15.4)
WBC: 8.7 10*3/uL (ref 3.4–10.8)

## 2021-03-12 LAB — LIPID PANEL
Chol/HDL Ratio: 3.3 ratio (ref 0.0–4.4)
Cholesterol, Total: 188 mg/dL (ref 100–199)
HDL: 57 mg/dL (ref >39)
LDL Chol Calc (NIH): 108 mg/dL — ABNORMAL HIGH (ref 0–99)
Triglycerides: 130 mg/dL (ref 0–149)
VLDL Cholesterol Cal: 23 mg/dL (ref 5–40)

## 2021-03-12 LAB — COMPREHENSIVE METABOLIC PANEL
ALT: 16 IU/L (ref 0–32)
AST: 22 IU/L (ref 0–40)
Albumin/Globulin Ratio: 1.7 (ref 1.2–2.2)
Albumin: 4.2 g/dL (ref 3.7–4.7)
Alkaline Phosphatase: 116 IU/L (ref 44–121)
BUN/Creatinine Ratio: 15 (ref 12–28)
BUN: 15 mg/dL (ref 8–27)
Bilirubin Total: <0.2 mg/dL (ref 0.0–1.2)
CO2: 24 mmol/L (ref 20–29)
Calcium: 9.2 mg/dL (ref 8.7–10.3)
Chloride: 104 mmol/L (ref 96–106)
Creatinine, Ser: 1.01 mg/dL — ABNORMAL HIGH (ref 0.57–1.00)
Globulin, Total: 2.5 g/dL (ref 1.5–4.5)
Glucose: 113 mg/dL — ABNORMAL HIGH (ref 70–99)
Potassium: 4.2 mmol/L (ref 3.5–5.2)
Sodium: 141 mmol/L (ref 134–144)
Total Protein: 6.7 g/dL (ref 6.0–8.5)
eGFR: 58 mL/min/{1.73_m2} — ABNORMAL LOW (ref >59)

## 2021-03-12 LAB — HEMOGLOBIN A1C
Est. average glucose Bld gHb Est-mCnc: 137 mg/dL
Hgb A1c MFr Bld: 6.4 % — ABNORMAL HIGH (ref 4.8–5.6)

## 2021-03-12 LAB — TSH: TSH: 3.36 u[IU]/mL (ref 0.450–4.500)

## 2021-09-03 ENCOUNTER — Telehealth: Payer: Self-pay

## 2021-09-03 NOTE — Telephone Encounter (Signed)
Copied from [place]. Topic: Medicare AWV ?>> Sep 03, 2021 10:49 AM Yvette Rack wrote: ?Reason for CRM: Pt would like to schedule Medicare AWV ?

## 2021-09-03 NOTE — Telephone Encounter (Signed)
Source Subject Topic  ?Amanda Carpenter (Patient) Amanda Carpenter (Patient) Medicare AWV  ?Summary: [name]  ?Reason for CRM: Pt wanted [name] TO RETURN THE CALL TO [facility] HER AAWV [phone]  ? ?

## 2021-09-08 ENCOUNTER — Ambulatory Visit: Payer: PPO | Admitting: Family Medicine

## 2021-09-23 ENCOUNTER — Ambulatory Visit (INDEPENDENT_AMBULATORY_CARE_PROVIDER_SITE_OTHER): Payer: PPO

## 2021-09-23 VITALS — Wt 249.0 lb

## 2021-09-23 DIAGNOSIS — H2513 Age-related nuclear cataract, bilateral: Secondary | ICD-10-CM | POA: Diagnosis not present

## 2021-09-23 DIAGNOSIS — Z0000 Encounter for general adult medical examination without abnormal findings: Secondary | ICD-10-CM

## 2021-09-23 DIAGNOSIS — H40053 Ocular hypertension, bilateral: Secondary | ICD-10-CM | POA: Diagnosis not present

## 2021-09-23 NOTE — Patient Instructions (Signed)
Amanda Carpenter , ?Thank you for taking time to come for your Medicare Wellness Visit. I appreciate your ongoing commitment to your health goals. Please review the following plan we discussed and let me know if I can assist you in the future.  ? ?Screening recommendations/referrals: ?Colonoscopy: aged out  ?Mammogram: aged out  ?Bone Density: 03/20/19 ?Recommended yearly ophthalmology/optometry visit for glaucoma screening and checkup ?Recommended yearly dental visit for hygiene and checkup ? ?Vaccinations: ?Influenza vaccine: n/d ?Pneumococcal vaccine: 01/09/15 ?Tdap vaccine: 02/16/11, due ?Shingles vaccine: n/d   ?Covid-19:06/28/19, 07/19/19, 05/09/20, 11/06/20 ? ?Advanced directives: no ? ?Conditions/risks identified: none ? ?Next appointment: Follow up in one year for your annual wellness visit 09/28/22 @ 3:15pm by phone ? ? ?Preventive Care 18 Years and Older, Female ?Preventive care refers to lifestyle choices and visits with your health care provider that can promote health and wellness. ?What does preventive care include? ?A yearly physical exam. This is also called an annual well check. ?Dental exams once or twice a year. ?Routine eye exams. Ask your health care provider how often you should have your eyes checked. ?Personal lifestyle choices, including: ?Daily care of your teeth and gums. ?Regular physical activity. ?Eating a healthy diet. ?Avoiding tobacco and drug use. ?Limiting alcohol use. ?Practicing safe sex. ?Taking low-dose aspirin every day. ?Taking vitamin and mineral supplements as recommended by your health care provider. ?What happens during an annual well check? ?The services and screenings done by your health care provider during your annual well check will depend on your age, overall health, lifestyle risk factors, and family history of disease. ?Counseling  ?Your health care provider may ask you questions about your: ?Alcohol use. ?Tobacco use. ?Drug use. ?Emotional well-being. ?Home and relationship  well-being. ?Sexual activity. ?Eating habits. ?History of falls. ?Memory and ability to understand (cognition). ?Work and work Statistician. ?Reproductive health. ?Screening  ?You may have the following tests or measurements: ?Height, weight, and BMI. ?Blood pressure. ?Lipid and cholesterol levels. These may be checked every 5 years, or more frequently if you are over 62 years old. ?Skin check. ?Lung cancer screening. You may have this screening every year starting at age 70 if you have a 30-pack-year history of smoking and currently smoke or have quit within the past 15 years. ?Fecal occult blood test (FOBT) of the stool. You may have this test every year starting at age 22. ?Flexible sigmoidoscopy or colonoscopy. You may have a sigmoidoscopy every 5 years or a colonoscopy every 10 years starting at age 65. ?Hepatitis C blood test. ?Hepatitis B blood test. ?Sexually transmitted disease (STD) testing. ?Diabetes screening. This is done by checking your blood sugar (glucose) after you have not eaten for a while (fasting). You may have this done every 1-3 years. ?Bone density scan. This is done to screen for osteoporosis. You may have this done starting at age 31. ?Mammogram. This may be done every 1-2 years. Talk to your health care provider about how often you should have regular mammograms. ?Talk with your health care provider about your test results, treatment options, and if necessary, the need for more tests. ?Vaccines  ?Your health care provider may recommend certain vaccines, such as: ?Influenza vaccine. This is recommended every year. ?Tetanus, diphtheria, and acellular pertussis (Tdap, Td) vaccine. You may need a Td booster every 10 years. ?Zoster vaccine. You may need this after age 41. ?Pneumococcal 13-valent conjugate (PCV13) vaccine. One dose is recommended after age 63. ?Pneumococcal polysaccharide (PPSV23) vaccine. One dose is recommended after age 31. ?  Talk to your health care provider about which  screenings and vaccines you need and how often you need them. ?This information is not intended to replace advice given to you by your health care provider. Make sure you discuss any questions you have with your health care provider. ?Document Released: 05/24/2015 Document Revised: 01/15/2016 Document Reviewed: 02/26/2015 ?Elsevier Interactive Patient Education ? 2017 [place]. ? ?Fall Prevention in the Home ?Falls can cause injuries. They can happen to people of all ages. There are many things you can do to make your home safe and to help prevent falls. ?What can I do on the outside of my home? ?Regularly fix the edges of walkways and driveways and fix any cracks. ?Remove anything that might make you trip as you walk through a door, such as a raised step or threshold. ?Trim any bushes or trees on the path to your home. ?Use bright outdoor lighting. ?Clear any walking paths of anything that might make someone trip, such as rocks or tools. ?Regularly check to see if handrails are loose or broken. Make sure that both sides of any steps have handrails. ?Any raised decks and porches should have guardrails on the edges. ?Have any leaves, snow, or ice cleared regularly. ?Use sand or salt on walking paths during winter. ?Clean up any spills in your garage right away. This includes oil or grease spills. ?What can I do in the bathroom? ?Use night lights. ?Install grab bars by the toilet and in the tub and shower. Do not use towel bars as grab bars. ?Use non-skid mats or decals in the tub or shower. ?If you need to sit down in the shower, use a plastic, non-slip stool. ?Keep the floor dry. Clean up any water that spills on the floor as soon as it happens. ?Remove soap buildup in the tub or shower regularly. ?Attach bath mats securely with double-sided non-slip rug tape. ?Do not have throw rugs and other things on the floor that can make you trip. ?What can I do in the bedroom? ?Use night lights. ?Make sure that you have a  light by your bed that is easy to reach. ?Do not use any sheets or blankets that are too big for your bed. They should not hang down onto the floor. ?Have a firm chair that has side arms. You can use this for support while you get dressed. ?Do not have throw rugs and other things on the floor that can make you trip. ?What can I do in the kitchen? ?Clean up any spills right away. ?Avoid walking on wet floors. ?Keep items that you use a lot in easy-to-reach places. ?If you need to reach something above you, use a strong step stool that has a grab bar. ?Keep electrical cords out of the way. ?Do not use floor polish or wax that makes floors slippery. If you must use wax, use non-skid floor wax. ?Do not have throw rugs and other things on the floor that can make you trip. ?What can I do with my stairs? ?Do not leave any items on the stairs. ?Make sure that there are handrails on both sides of the stairs and use them. Fix handrails that are broken or loose. Make sure that handrails are as long as the stairways. ?Check any carpeting to make sure that it is firmly attached to the stairs. Fix any carpet that is loose or worn. ?Avoid having throw rugs at the top or bottom of the stairs. If you do have throw  rugs, attach them to the floor with carpet tape. ?Make sure that you have a light switch at the top of the stairs and the bottom of the stairs. If you do not have them, ask someone to add them for you. ?What else can I do to help prevent falls? ?Wear shoes that: ?Do not have high heels. ?Have rubber bottoms. ?Are comfortable and fit you well. ?Are closed at the toe. Do not wear sandals. ?If you use a stepladder: ?Make sure that it is fully opened. Do not climb a closed stepladder. ?Make sure that both sides of the stepladder are locked into place. ?Ask someone to hold it for you, if possible. ?Clearly mark and make sure that you can see: ?Any grab bars or handrails. ?First and last steps. ?Where the edge of each step  is. ?Use tools that help you move around (mobility aids) if they are needed. These include: ?Canes. ?Walkers. ?Scooters. ?Crutches. ?Turn on the lights when you go into a dark area. Replace any light bulbs as

## 2021-09-23 NOTE — Progress Notes (Signed)
Virtual Visit via Telephone Note  I connected with  Amanda Carpenter on 09/23/21 at  3:00 PM EDT by telephone and verified that I am speaking with the correct person using two identifiers.  Location: Patient: home Provider: [facility] Persons participating in the virtual visit: Amanda Carpenter   I discussed the limitations, risks, security and privacy concerns of performing an evaluation and management service by telephone and the availability of in person appointments. The patient expressed understanding and agreed to proceed.  Interactive audio and video telecommunications were attempted between this nurse and patient, however failed, due to patient having technical difficulties OR patient did not have access to video capability.  We continued and completed visit with audio only.  Some vital signs may be absent or patient reported.   Amanda David, LPN  Subjective:   Amanda Carpenter is a 76 y.o. female who presents for Medicare Annual (Subsequent) preventive examination.  Review of Systems    no       Objective:    There were no vitals filed for this visit. There is no height or weight on file to calculate BMI.     05/23/2020    8:59 AM 01/25/2017    9:09 AM 01/21/2017    9:38 AM 04/20/2016   12:57 PM 04/20/2016    6:08 AM 04/10/2016    9:18 AM 11/05/2015    4:24 PM  Advanced Directives  Does Patient Have a Medical Advance Directive? Yes Yes Yes Yes Yes Yes No  Type of Paramedic of [place];Living will [place];Living will Healthcare Power of [place];Living will [place];Living will   Does patient want to make changes to medical advance directive?    No - Patient declined  No - Patient declined   Copy of [place] in Chart? Yes - validated most recent copy scanned in chart (See row information)  No - copy requested No - copy  requested No - copy requested No - copy requested   Would patient like information on creating a medical advance directive?       No - patient declined information    Current Medications (verified) Outpatient Encounter Medications as of 09/23/2021  Medication Sig   alendronate (FOSAMAX) 70 MG tablet TAKE 1 TABLET BY MOUTH ONCE WEEKLY ON AN EMPTY STOMACH BEFORE BREAKFAST. REMAIN UPRIGHT FOR 30 MINUTES & TAKE WITH 8 OUNCES OF WATER (Patient not taking: Reported on 03/11/2021)   calcium citrate (CALCITRATE - DOSED IN MG ELEMENTAL CALCIUM) 950 MG tablet Take 200 mg of elemental calcium by mouth daily.   ELDERBERRY PO Take 2 tablets by mouth daily at 6 (six) AM. Gummies   fluticasone (FLONASE) 50 MCG/ACT nasal spray Place 1 spray into both nostrils daily. As needed (Patient not taking: Reported on 03/11/2021)   omeprazole (PRILOSEC) 40 MG capsule Take 1 capsule (40 mg total) by mouth daily.   OVER THE COUNTER MEDICATION Goli gummies 1 a day   UNABLE TO FIND Med Name: Sulfur Crystals- 1.5 teaspoons in the morning   Vitamin D, Ergocalciferol, 2000 units CAPS Take 2,000 Units by mouth daily.   No facility-administered encounter medications on file as of 09/23/2021.    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 RT   Family History   Problem Relation Age of Onset   Healthy Mother    Hypertension Mother    Hyperlipidemia Mother    Heart attack Father 79   Breast cancer Sister 52   Social History   Socioeconomic History   Marital status: Married    Spouse name: Amanda Carpenter   Number of children: 2   Years of education: bachelor's   Highest education level: Bachelor's degree (e.g., BA, AB, BS)  Occupational History    Employer: [employer]    Comment: 2.5 days a week  Tobacco Use   Smoking status: Former    Packs/day: 1.00    Years: 10.00    Pack years: 10.00    Types: Cigarettes    Quit date: 05/11/1985    Years since quitting: 36.3   Smokeless tobacco: Never  Vaping Use   Vaping Use: Never used  Substance and Sexual Activity   Alcohol use: Yes    Alcohol/week: 2.0 standard drinks    Types: 2 Glasses of wine per week   Drug use: No   Sexual activity: Never  Other Topics Concern   Not on file  Social History Narrative   Not on file   Social Determinants of Health   Financial Resource Strain: Not on file  Food Insecurity: Not on file  Transportation Needs: Not on file  Physical Activity: Not on file  Stress: Not on file  Social Connections: Not on file    Tobacco Counseling Counseling given: Not Answered   Clinical Intake:  Pre-visit preparation completed: Yes  Pain : No/denies pain     Nutritional Risks: None Diabetes: No  How often do you need to have someone help you when you read instructions, pamphlets, or other written materials from your doctor or pharmacy?: 1 - Never  Diabetic?no  Interpreter Needed?: No  Information entered by :: Amanda Shaggy, LPN   Activities of Daily Living    03/11/2021    2:58 PM  In your present state of health, do you have any difficulty performing the following activities:  Comment Patient states sometimes  Vision? 1  Difficulty concentrating or making decisions? 1  Walking or climbing stairs? 1  Dressing or bathing? 0  Doing errands,  shopping? 0    Patient Care Team: Amanda Carpenter., MD as PCP - General (Family Medicine) Amanda Gust, MD (Otolaryngology) Amanda Robson, MD as Referring Physician (Ophthalmology)  Indicate any recent Medical Services you may have received from other than [facility] providers in the past year (date may be approximate).     Assessment:   This is a routine wellness examination for Amanda Carpenter.  Hearing/Vision screen No results found.  Dietary issues and exercise activities discussed:     Goals Addressed   None    Depression Screen    03/11/2021    2:58 PM 05/23/2020    8:57 AM 02/09/2020  10:13 AM 02/06/2019    8:53 AM 01/31/2018    8:55 AM 01/21/2017    9:39 AM 01/23/2016    8:58 AM  PHQ 2/9 Scores  PHQ - 2 Score 0 0 0 0 0 0 0  PHQ- 9 Score 3  0 0 0      Fall Risk    03/11/2021    2:58 PM 05/23/2020    8:59 AM 02/09/2020   10:13 AM 02/06/2019    8:54 AM 01/31/2018    8:55 AM  Fall Risk   Falls in the past year? 0 0 1 0 No  Number falls in past yr: 0 0 1 0   Injury with Fall? 0 0 0 0   Follow up   Falls evaluation completed Falls evaluation completed     FALL RISK PREVENTION PERTAINING TO THE HOME:  Any stairs in or around the home? No  If so, are there any without handrails? No  Home free of loose throw rugs in walkways, pet beds, electrical cords, etc? Yes  Adequate lighting in your home to reduce risk of falls? Yes   ASSISTIVE DEVICES UTILIZED TO PREVENT FALLS:  Life alert? Yes  Use of a cane, walker or w/c? No  Grab bars in the bathroom? Yes  Shower chair or bench in shower? Yes  Elevated toilet seat or a handicapped toilet? Yes    Cognitive Function:0 points 6CIT        02/09/2020   10:19 AM 02/06/2019    8:58 AM  6CIT Screen  What Year? 0 points 0 points  What month? 0 points 0 points  What time? 0 points 0 points  Count back from 20 0 points 0 points  Months in reverse 0 points 0 points  Repeat phrase 0 points 0 points  Total Score 0  points 0 points    Immunizations Immunization History  Administered Date(s) Administered   Moderna SARS-COV2 Booster Vaccination 11/06/2020   PFIZER(Purple Top)SARS-COV-2 Vaccination 06/28/2019, 07/19/2019, 05/09/2020   Pneumococcal Conjugate-13 01/09/2015   Pneumococcal Polysaccharide-23 11/02/2012   Td 05/20/2005   Tdap 02/16/2011    TDAP status: Due, Education has been provided regarding the importance of this vaccine. Advised may receive this vaccine at local pharmacy or Health Dept. Aware to provide a copy of the vaccination record if obtained from local pharmacy or Health Dept. Verbalized acceptance and understanding.  Flu Vaccine status: Declined, Education has been provided regarding the importance of this vaccine but patient still declined. Advised may receive this vaccine at local pharmacy or Health Dept. Aware to provide a copy of the vaccination record if obtained from local pharmacy or Health Dept. Verbalized acceptance and understanding.  Pneumococcal vaccine status: Up to date  Covid-19 vaccine status: Completed vaccines  Qualifies for Shingles Vaccine? Yes   Zostavax completed No   Shingrix Completed?: Yes  Screening Tests Health Maintenance  Topic Date Due   Zoster Vaccines- Shingrix (1 of 2) Never done   COVID-19 Vaccine (4 - Booster for Pfizer series) 01/01/2021   TETANUS/TDAP  02/15/2021   DEXA SCAN  03/19/2021   INFLUENZA VACCINE  12/09/2021   Pneumonia Vaccine 36+ Years old  Completed   Hepatitis C Screening  Completed   HPV VACCINES  Aged Out   COLONOSCOPY (Pts 45-67yr Insurance coverage will need to be confirmed)  Discontinued   Fecal DNA (Cologuard)  Discontinued    Health Maintenance  Health Maintenance Due  Topic Date Due   Zoster Vaccines- Shingrix (1 of  2) Never done   COVID-19 Vaccine (4 - Booster for Pfizer series) 01/01/2021   TETANUS/TDAP  02/15/2021   DEXA SCAN  03/19/2021    Colorectal cancer screening: No longer required.    Mammogram status: No longer required due to age.  Bone Density status: Completed 03/20/19. Results reflect: Bone density results: OSTEOPOROSIS. Repeat every 2 years.- declined referral  Lung Cancer Screening: (Low Dose CT Chest recommended if Age 49-80 years, 30 pack-year currently smoking OR have quit w/in 15years.) does not qualify.   Additional Screening:  Hepatitis C Screening: does qualify; Completed 01/23/16  Vision Screening: Recommended annual ophthalmology exams for early detection of glaucoma and other disorders of the eye. Is the patient up to date with their annual eye exam?  Yes  Who is the provider or what is the name of the office in which the patient attends annual eye exams? Dr.Jackson If pt is not established with a provider, would they like to be referred to a provider to establish care? No .   Dental Screening: Recommended annual dental exams for proper oral hygiene  Community Resource Referral / Chronic Care Management: CRR required this visit?  No   CCM required this visit?  No      Plan:     I have personally reviewed and noted the following in the patient's chart:   Medical and social history Use of alcohol, tobacco or illicit drugs  Current medications and supplements including opioid prescriptions.  Functional ability and status Nutritional status Physical activity Advanced directives List of other physicians Hospitalizations, surgeries, and ER visits in previous 12 months Vitals Screenings to include cognitive, depression, and falls Referrals and appointments  In addition, I have reviewed and discussed with patient certain preventive protocols, quality metrics, and best practice recommendations. A written personalized care plan for preventive services as well as general preventive health recommendations were provided to patient.     Amanda David, LPN   2/58/[id number]   Nurse Notes: none

## 2021-11-05 DIAGNOSIS — H2511 Age-related nuclear cataract, right eye: Secondary | ICD-10-CM | POA: Diagnosis not present

## 2021-11-14 ENCOUNTER — Encounter: Payer: Self-pay | Admitting: Ophthalmology

## 2021-11-24 NOTE — Discharge Instructions (Signed)

## 2021-11-26 ENCOUNTER — Ambulatory Visit
Admission: RE | Admit: 2021-11-26 | Discharge: 2021-11-26 | Disposition: A | Discharge status: Home / Self Care | Payer: PPO | Source: Ambulatory Visit | Attending: Ophthalmology | Admitting: Ophthalmology

## 2021-11-26 ENCOUNTER — Ambulatory Visit: Payer: PPO | Admitting: Anesthesiology

## 2021-11-26 ENCOUNTER — Other Ambulatory Visit: Payer: Self-pay

## 2021-11-26 ENCOUNTER — Encounter
Admission: RE | Disposition: A | Discharge status: Home / Self Care | Payer: Self-pay | Source: Ambulatory Visit | Attending: Ophthalmology

## 2021-11-26 ENCOUNTER — Encounter: Payer: Self-pay | Admitting: Ophthalmology

## 2021-11-26 DIAGNOSIS — M1990 Unspecified osteoarthritis, unspecified site: Secondary | ICD-10-CM | POA: Insufficient documentation

## 2021-11-26 DIAGNOSIS — H2511 Age-related nuclear cataract, right eye: Secondary | ICD-10-CM | POA: Diagnosis not present

## 2021-11-26 DIAGNOSIS — Z6841 Body Mass Index (BMI) 40.0 and over, adult: Secondary | ICD-10-CM | POA: Diagnosis not present

## 2021-11-26 DIAGNOSIS — Z87891 Personal history of nicotine dependence: Secondary | ICD-10-CM | POA: Insufficient documentation

## 2021-11-26 DIAGNOSIS — K219 Gastro-esophageal reflux disease without esophagitis: Secondary | ICD-10-CM | POA: Insufficient documentation

## 2021-11-26 DIAGNOSIS — Z8521 Personal history of malignant neoplasm of larynx: Secondary | ICD-10-CM | POA: Diagnosis not present

## 2021-11-26 DIAGNOSIS — I739 Peripheral vascular disease, unspecified: Secondary | ICD-10-CM | POA: Insufficient documentation

## 2021-11-26 HISTORY — PX: CATARACT EXTRACTION W/PHACO: SHX586

## 2021-11-26 SURGERY — PHACOEMULSIFICATION, CATARACT, WITH IOL INSERTION
Anesthesia: Monitor Anesthesia Care | Site: Eye | Laterality: Right

## 2021-11-26 MED ORDER — BRIMONIDINE TARTRATE-TIMOLOL 0.2-0.5 % OP SOLN
OPHTHALMIC | Status: DC | PRN
  Administered 2021-11-26: 1 [drp] via OPHTHALMIC

## 2021-11-26 MED ORDER — SIGHTPATH DOSE#1 BSS IO SOLN
INTRAOCULAR | Status: DC | PRN
  Administered 2021-11-26: 15 mL

## 2021-11-26 MED ORDER — ARMC OPHTHALMIC DILATING DROPS
1.0000 | OPHTHALMIC | Status: DC | PRN
  Administered 2021-11-26 (×3): 1 via OPHTHALMIC

## 2021-11-26 MED ORDER — CEFUROXIME OPHTHALMIC INJECTION 1 MG/0.1 ML
INJECTION | OPHTHALMIC | Status: DC | PRN
  Administered 2021-11-26: 0.1 mL via OPHTHALMIC

## 2021-11-26 MED ORDER — FENTANYL CITRATE (PF) 100 MCG/2ML IJ SOLN
INTRAMUSCULAR | Status: DC | PRN
  Administered 2021-11-26: 50 ug via INTRAVENOUS

## 2021-11-26 MED ORDER — SIGHTPATH DOSE#1 BSS IO SOLN
INTRAOCULAR | Status: DC | PRN
  Administered 2021-11-26: 1 mL via INTRAMUSCULAR

## 2021-11-26 MED ORDER — SIGHTPATH DOSE#1 NA HYALUR & NA CHOND-NA HYALUR IO KIT
PACK | INTRAOCULAR | Status: DC | PRN
  Administered 2021-11-26: 1 via OPHTHALMIC

## 2021-11-26 MED ORDER — MIDAZOLAM HCL 2 MG/2ML IJ SOLN
INTRAMUSCULAR | Status: DC | PRN
  Administered 2021-11-26: 1 mg via INTRAVENOUS

## 2021-11-26 MED ORDER — TETRACAINE HCL 0.5 % OP SOLN
1.0000 [drp] | OPHTHALMIC | Status: DC | PRN
  Administered 2021-11-26 (×3): 1 [drp] via OPHTHALMIC

## 2021-11-26 SURGICAL SUPPLY — 12 items
CATARACT SUITE SIGHTPATH (MISCELLANEOUS) ×2 IMPLANT
FEE CATARACT SUITE SIGHTPATH (MISCELLANEOUS) ×1 IMPLANT
GLOVE SRG 8 PF TXTR STRL LF DI (GLOVE) ×1 IMPLANT
GLOVE SURG ENC TEXT LTX SZ7.5 (GLOVE) ×2 IMPLANT
GLOVE SURG UNDER POLY LF SZ8 (GLOVE) ×2
LENS IOL TECNIS EYHANCE 22.0 (Intraocular Lens) ×1 IMPLANT
NDL FILTER BLUNT 18X1 1/2 (NEEDLE) ×1 IMPLANT
NEEDLE FILTER BLUNT 18X 1/2SAF (NEEDLE) ×1
NEEDLE FILTER BLUNT 18X1 1/2 (NEEDLE) ×1 IMPLANT
RING MALYGIN 7.0 (MISCELLANEOUS) ×1 IMPLANT
SYR 3ML LL SCALE MARK (SYRINGE) ×2 IMPLANT
WATER STERILE IRR 250ML POUR (IV SOLUTION) ×2 IMPLANT

## 2021-11-26 NOTE — Anesthesia Preprocedure Evaluation (Signed)
Anesthesia Evaluation  Patient identified by MRN, date of birth, ID band Patient awake    Reviewed: Allergy & Precautions, NPO status , Patient's Chart, lab work & pertinent test results  Airway Mallampati: II  TM Distance: >3 FB Neck ROM: Full    Dental no notable dental hx.    Pulmonary former smoker,  H/o vocal cord cancer   Pulmonary exam normal        Cardiovascular + Peripheral Vascular Disease  Normal cardiovascular exam     Neuro/Psych negative neurological ROS  negative psych ROS   GI/Hepatic Neg liver ROS, GERD  Controlled,  Endo/Other  Morbid obesity (BMI 42)  Renal/GU negative Renal ROS     Musculoskeletal  (+) Arthritis ,   Abdominal (+) + obese,   Peds  Hematology negative hematology ROS (+)   Anesthesia Other Findings   Reproductive/Obstetrics                             Anesthesia Physical Anesthesia Plan  ASA: 3  Anesthesia Plan: MAC   Post-op Pain Management: Minimal or no pain anticipated   Induction: Intravenous  PONV Risk Score and Plan: 2 and TIVA, Midazolam and Treatment may vary due to age or medical condition  Airway Management Planned: Nasal Cannula and Natural Airway  Additional Equipment:   Intra-op Plan:   Post-operative Plan:   Informed Consent: I have reviewed the patients History and Physical, chart, labs and discussed the procedure including the risks, benefits and alternatives for the proposed anesthesia with the patient or authorized representative who has indicated his/her understanding and acceptance.     Dental advisory given  Plan Discussed with: CRNA  Anesthesia Plan Comments:         Anesthesia Quick Evaluation

## 2021-11-26 NOTE — Transfer of Care (Signed)
Immediate Anesthesia Transfer of Care Note  Patient: Amanda Carpenter  Procedure(s) Performed: CATARACT EXTRACTION PHACO AND INTRAOCULAR LENS PLACEMENT (IOC) RIGHT (Right)  Patient Location: PACU  Anesthesia Type: MAC  Level of Consciousness: awake, alert  and patient cooperative  Airway and Oxygen Therapy: Patient Spontanous Breathing and Patient connected to supplemental oxygen  Post-op Assessment: Post-op Vital signs reviewed, Patient's Cardiovascular Status Stable, Respiratory Function Stable, Patent Airway and No signs of Nausea or vomiting  Post-op Vital Signs: Reviewed and stable  Complications: No notable events documented.

## 2021-11-26 NOTE — Op Note (Signed)
OPERATIVE NOTE  Amanda Carpenter [id number] 11/26/2021   PREOPERATIVE DIAGNOSIS:    Nuclear Sclerotic Cataract Right eye with miotic pupil.        H25.11  POSTOPERATIVE DIAGNOSIS: Nuclear Sclerotic Cataract Right eye with miotic pupil.          PROCEDURE:  Phacoemusification with posterior chamber intraocular lens placement of the right eye which required pupil stretching with the Malyugin pupil expansion device. Ultrasound time: Procedure(s) with comments: CATARACT EXTRACTION PHACO AND INTRAOCULAR LENS PLACEMENT (IOC) RIGHT (Right) - 7.18 01:01.3  LENS:   Implant Name Type Inv. Item Serial No. Manufacturer Lot No. LRB No. Used Action  LENS IOL TECNIS EYHANCE 22.0 - [id number] Intraocular Lens LENS IOL TECNIS EYHANCE 22.0 [id number] [employer]  Right 1 Implanted       SURGEON:  Wyonia Hough, MD   ANESTHESIA:  Topical with tetracaine drops and 2% Xylocaine jelly, augmented with 1% preservative-free intracameral lidocaine.   COMPLICATIONS:  None.   DESCRIPTION OF PROCEDURE:  The patient was identified in the holding room and transported to the operating room and placed in the supine position under the operating microscope. Theright eye was identified as the operative eye and it was prepped and draped in the usual sterile ophthalmic fashion.   A 1 millimeter clear-corneal paracentesis was made at the 12:00 position.  0.5 ml of preservative-free 1% lidocaine was injected into the anterior chamber. The anterior chamber was filled with Viscoat viscoelastic.  A 2.4 millimeter keratome was used to make a near-clear corneal incision at the 9:00 position. A Malyugin pupil expander was then placed through the main incision and into the anterior chamber of the eye.  The edge of the iris was secured on the lip of the pupil expander and it was released, thereby expanding the pupil to approximately 7 millimeters for completion of the cataract surgery.  Additional Viscoat was placed in the  anterior chamber.  A cystotome and capsulorrhexis forceps were used to make a curvilinear capsulorrhexis.   Balanced salt solution was used to hydrodissect and hydrodelineate the lens nucleus.   Phacoemulsification was used in stop and chop fashion to remove the lens, nucleus and epinucleus.  The remaining cortex was aspirated using the irrigation aspiration handpiece.  Additional Provisc was placed into the eye to distend the capsular bag for lens placement.  A lens was then injected into the capsular bag.  The pupil expanding ring was removed using a Kuglen hook and insertion device. The remaining viscoelastic was aspirated from the capsular bag and the anterior chamber.  The anterior chamber was filled with balanced salt solution to inflate to a physiologic pressure.  Wounds were hydrated with balanced salt solution.  The anterior chamber was inflated to a physiologic pressure with balanced salt solution.  No wound leaks were noted.Cefuroxime 0.1 ml of a '10mg'$ /ml solution was injected into the anterior chamber for a dose of 1 mg of intracameral antibiotic at the completion of the case. Timolol and Brimonidine drops were applied to the eye.  The patient was taken to the recovery room in stable condition without complications of anesthesia or surgery.  Amanda Carpenter 11/26/2021, 2:40 PM

## 2021-11-26 NOTE — H&P (Signed)
[facility]   Primary Care Physician:  Jerrol Banana., MD Ophthalmologist: Dr. Leandrew Koyanagi  Pre-Procedure History & Physical: HPI:  Amanda Carpenter is a 76 y.o. female here for ophthalmic surgery.   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    Prior to Admission medications   Medication Sig Start Date End Date Taking? Authorizing Provider  calcium citrate (CALCITRATE - DOSED IN MG ELEMENTAL CALCIUM) 950 MG tablet Take 200 mg of elemental calcium by mouth daily.   Yes [provider]  ELDERBERRY PO Take 2 tablets by mouth daily at 6 (six) AM. Gummies   Yes [provider]  fluticasone (FLONASE) 50 MCG/ACT nasal spray Place 1 spray into both nostrils daily. As needed 01/10/20  Yes [provider]  omeprazole (PRILOSEC) 40 MG capsule Take 1 capsule (40 mg total) by mouth daily. 12/23/20  Yes Jerrol Banana., MD  OVER THE COUNTER MEDICATION Goli gummies 1 a day   Yes [provider]  UNABLE TO FIND Med Name: Sulfur Crystals- 1.5 teaspoons in the morning   Yes [provider]  Vitamin D, Ergocalciferol, 2000 units CAPS Take 2,000 Units by mouth daily.   Yes [provider]  alendronate (FOSAMAX) 70 MG tablet TAKE 1 TABLET BY MOUTH ONCE WEEKLY ON AN EMPTY STOMACH BEFORE BREAKFAST. REMAIN UPRIGHT  FOR 30 MINUTES & TAKE WITH 8 OUNCES OF WATER Patient not taking: Reported on 03/11/2021 02/11/21   Jerrol Banana., MD    Allergies as of 09/24/2021 - Review Complete 09/23/2021  Allergen Reaction Noted   Motrin [ibuprofen]  04/10/2016    Family History  Problem Relation Age of Onset   Healthy Mother    Hypertension Mother    Hyperlipidemia Mother    Heart attack Father 55   Breast cancer Sister 58    Social History   Socioeconomic History   Marital status: Married    Spouse name: Deidre Ala   Number of children: 2   Years of education: bachelor's   Highest education level: Bachelor's degree (e.g., BA, AB, BS)  Occupational History    Employer: [employer]    Comment: 2.5 days a week  Tobacco Use   Smoking status: Former    Packs/day: 1.00    Years: 10.00    Total pack years: 10.00    Types: Cigarettes    Quit date: 05/11/1985    Years since quitting: 36.5   Smokeless tobacco: Never  Vaping Use   Vaping Use: Never used  Substance and Sexual Activity   Alcohol use: Yes    Alcohol/week: 2.0 standard drinks of alcohol    Types: 2 Glasses of wine per week   Drug use: No   Sexual activity: Never  Other  Topics Concern   Not on file  Social History Narrative   Not on file   Social Determinants of Health   Financial Resource Strain: Low Risk  (09/23/2021)   Overall Financial Resource Strain (CARDIA)    Difficulty of Paying Living Expenses: Not hard at all  Food Insecurity: No Food Insecurity (09/23/2021)   Hunger Vital Sign    Worried About Running Out of Food in the Last Year: Never true    Ran Out of Food in the Last Year: Never true  Transportation Needs: No Transportation Needs (09/23/2021)   PRAPARE - Hydrologist (Medical): No    Lack of Transportation (Non-Medical): No  Physical Activity: Insufficiently Active (09/23/2021)   Exercise Vital Sign    Days of Exercise per Week: 3 days    Minutes of Exercise per Session: 30 min   Stress: No Stress Concern Present (09/23/2021)   [place]    Feeling of Stress : Only a little  Social Connections: Socially Integrated (09/23/2021)   Social Connection and Isolation Panel [NHANES]    Frequency of Communication with Friends and Family: More than three times a week    Frequency of Social Gatherings with Friends and Family: More than three times a week    Attends Religious Services: More than 4 times per year    Active Member of [employer] or Organizations: Yes    Attends Music therapist: More than 4 times per year    Marital Status: Married  Human resources officer Violence: Not At Risk (09/23/2021)   Humiliation, Afraid, Rape, and Kick questionnaire    Fear of Current or Ex-Partner: No    Emotionally Abused: No    Physically Abused: No    Sexually Abused: No    Review of Systems: See HPI, otherwise negative ROS  Physical Exam: BP (!) 147/88   Pulse 66   Temp (!) 97.3 F (36.3 C) (Temporal)   Resp (!) 22   Ht '5\' 4"'$  (1.626 m)   Wt 111.6 kg   SpO2 98%   BMI 42.23 kg/m  General:   Alert,  pleasant and cooperative in NAD Head:  Normocephalic and atraumatic. Lungs:  Clear to auscultation.    Heart:  Regular rate and rhythm.   Impression/Plan: Amanda Carpenter is here for ophthalmic surgery.  Risks, benefits, limitations, and alternatives regarding ophthalmic surgery have been reviewed with the patient.  Questions have been answered.  All parties agreeable.   Leandrew Koyanagi, MD  11/26/2021, 1:33 PM

## 2021-11-26 NOTE — Anesthesia Postprocedure Evaluation (Signed)
Anesthesia Post Note  Patient: Freight forwarder  Procedure(s) Performed: CATARACT EXTRACTION PHACO AND INTRAOCULAR LENS PLACEMENT (IOC) RIGHT (Right: Eye)     Patient location during evaluation: PACU Anesthesia Type: MAC Level of consciousness: awake and alert Pain management: pain level controlled Vital Signs Assessment: post-procedure vital signs reviewed and stable Respiratory status: spontaneous breathing and nonlabored ventilation Cardiovascular status: blood pressure returned to baseline Postop Assessment: no apparent nausea or vomiting Anesthetic complications: no   No notable events documented.  Kimberli Winne [employer]

## 2021-11-27 ENCOUNTER — Encounter: Payer: Self-pay | Admitting: Ophthalmology

## 2021-12-08 ENCOUNTER — Other Ambulatory Visit: Payer: Self-pay | Admitting: Family Medicine

## 2021-12-10 NOTE — Telephone Encounter (Signed)
Requested Prescriptions  Pending Prescriptions Disp Refills  . omeprazole (PRILOSEC) 40 MG capsule [Pharmacy Med Name: OMEPRAZOLE DR 40 MG CAPSULE] 90 capsule 0    Sig: TAKE ONE CAPSULE BY MOUTH DAILY     Gastroenterology: Proton Pump Inhibitors Passed - 12/08/2021  5:27 PM      Passed - Valid encounter within last 12 months    Recent Outpatient Visits          9 months ago Annual physical exam   [facility] Jerrol Banana., MD   1 year ago COVID-19   [place] C[facility] Section, Kirstie Peri, MD   2 years ago Medicare annual wellness visit, subsequent   [place], Dionne Bucy, MD   3 years ago Medicare annual wellness visit, subsequent   [place], Dionne Bucy, MD   4 years ago Cough   [facility] Jerrol Banana., MD      Future Appointments            In 3 months Jerrol Banana., MD [facility] [place]

## 2022-03-09 ENCOUNTER — Other Ambulatory Visit: Payer: Self-pay | Admitting: Family Medicine

## 2022-03-09 NOTE — Telephone Encounter (Signed)
Medication Refill - Medication: omeprazole (PRILOSEC) 40 MG capsule  Has the patient contacted their pharmacy? No. (This was last refilled by Dr Rosanna Randy, she said Rosanna Randy told her to call and someone will refill. She is going w/ him to [facility].  Preferred Pharmacy (with phone number or street name):  [facility] [id number], [place] Has the patient been seen for an appointment in the last year OR does the patient have an upcoming appointment? Yes.    Agent: Please be advised that RX refills may take up to 3 business days. We ask that you follow-up with your pharmacy.

## 2022-03-09 NOTE — Telephone Encounter (Signed)
Patient former Dr. Rosanna Randy patient and plans to continue to see him at his new practice. Pt needs refill until OV with Dr. Rosanna Randy, see previous message.

## 2022-03-13 ENCOUNTER — Ambulatory Visit (INDEPENDENT_AMBULATORY_CARE_PROVIDER_SITE_OTHER): Payer: PPO | Admitting: Family Medicine

## 2022-03-13 ENCOUNTER — Encounter: Payer: Self-pay | Admitting: Family Medicine

## 2022-03-13 VITALS — BP 122/65 | HR 65 | Temp 98.3°F | Resp 16 | Wt 253.8 lb

## 2022-03-13 DIAGNOSIS — K219 Gastro-esophageal reflux disease without esophagitis: Secondary | ICD-10-CM | POA: Diagnosis not present

## 2022-03-13 MED ORDER — OMEPRAZOLE 40 MG PO CPDR: 40.0000 mg | DELAYED_RELEASE_CAPSULE | Freq: Every day | ORAL | 3 refills | Status: DC

## 2022-03-13 NOTE — Assessment & Plan Note (Signed)
Chronic GERD without esophagitis or hemorrhage Continue Prilosec 40 mg to assist Has been taking Tums PRN for evening complaints if needed following spicy food intake

## 2022-03-13 NOTE — Progress Notes (Signed)
I,Sulibeya S Dimas,acting as a Education administrator for Gwyneth Sprout, FNP.,have documented all relevant documentation on the behalf of Gwyneth Sprout, FNP,as directed by  Gwyneth Sprout, FNP while in the presence of Gwyneth Sprout, FNP.   Established patient visit  Patient: Amanda Carpenter   DOB: [birth year]   76 y.o. Female  MRN: [id number] Visit Date: 03/13/2022  Today's healthcare provider: Gwyneth Sprout, FNP  Patient presents for new patient visit to establish care.  Introduced to Designer, jewellery role and practice setting.  All questions answered.  Discussed provider/patient relationship and expectations.  Chief Complaint  Patient presents with   Gastroesophageal Reflux   Subjective    HPI  GERD, Follow up:  The patient was last seen for GERD 1 years ago. Changes made since that visit include no changes.  She reports excellent compliance with treatment. She is not having side effects. .  She IS experiencing cough, heartburn, and regurgitation of undigested food. She is NOT experiencing abdominal bloating, belching, chest pain, choking on food, or difficulty swallowing  -----------------------------------------------------------------------------------------  Medications: Outpatient Medications Prior to Visit  Medication Sig   calcium citrate (CALCITRATE - DOSED IN MG ELEMENTAL CALCIUM) 950 MG tablet Take 200 mg of elemental calcium by mouth daily.   ELDERBERRY PO Take 2 tablets by mouth daily at 6 (six) AM. Gummies   fluticasone (FLONASE) 50 MCG/ACT nasal spray Place 1 spray into both nostrils daily. As needed   OVER THE COUNTER MEDICATION Goli gummies 1 a day   UNABLE TO FIND Med Name: Sulfur Crystals- 1.5 teaspoons in the morning   Vitamin D, Ergocalciferol, 2000 units CAPS Take 2,000 Units by mouth daily.   [DISCONTINUED] omeprazole (PRILOSEC) 40 MG capsule TAKE ONE CAPSULE BY MOUTH DAILY   [DISCONTINUED] alendronate (FOSAMAX) 70 MG tablet TAKE 1 TABLET BY MOUTH ONCE WEEKLY ON AN  EMPTY STOMACH BEFORE BREAKFAST. REMAIN UPRIGHT FOR 30 MINUTES & TAKE WITH 8 OUNCES OF WATER (Patient not taking: Reported on 03/11/2021)   No facility-administered medications prior to visit.    Review of Systems  Constitutional:  Negative for appetite change and fatigue.  Respiratory:  Positive for cough. Negative for chest tightness and shortness of breath.   Cardiovascular:  Negative for chest pain and leg swelling.  Gastrointestinal:  Negative for abdominal distention, abdominal pain, constipation, diarrhea, nausea and vomiting.     Objective    BP 122/65 (BP Location: Left Arm, Patient Position: Sitting, Cuff Size: Large)   Pulse 65   Temp 98.3 F (36.8 C) (Oral)   Resp 16   Wt 253 lb 12.8 oz (115.1 kg)   BMI 43.56 kg/m  BP Readings from Last 3 Encounters:  03/13/22 122/65  11/26/21 111/68  03/11/21 (!) 149/58   Wt Readings from Last 3 Encounters:  03/13/22 253 lb 12.8 oz (115.1 kg)  11/26/21 246 lb (111.6 kg)  09/23/21 249 lb (112.9 kg)    Physical Exam Vitals and nursing note reviewed.  Constitutional:      General: She is not in acute distress.    Appearance: Normal appearance. She is obese. She is not ill-appearing, toxic-appearing or diaphoretic.  HENT:     Head: Normocephalic and atraumatic.  Cardiovascular:     Rate and Rhythm: Normal rate and regular rhythm.     Pulses: Normal pulses.     Heart sounds: Normal heart sounds. No murmur heard.    No friction rub. No gallop.  Pulmonary:     Effort: Pulmonary effort is  normal. No respiratory distress.     Breath sounds: Normal breath sounds. No stridor. No wheezing, rhonchi or rales.  Chest:     Chest wall: No tenderness.  Abdominal:     General: Bowel sounds are normal.     Palpations: Abdomen is soft.  Musculoskeletal:        General: No swelling, tenderness, deformity or signs of injury. Normal range of motion.     Right lower leg: No edema.     Left lower leg: No edema.  Skin:    General: Skin is  warm and dry.     Capillary Refill: Capillary refill takes less than 2 seconds.     Coloration: Skin is not jaundiced or pale.     Findings: No bruising, erythema, lesion or rash.  Neurological:     General: No focal deficit present.     Mental Status: She is alert and oriented to person, place, and time. Mental status is at baseline.     Cranial Nerves: No cranial nerve deficit.     Sensory: No sensory deficit.     Motor: No weakness.     Coordination: Coordination normal.  Psychiatric:        Mood and Affect: Mood normal.        Behavior: Behavior normal.        Thought Content: Thought content normal.        Judgment: Judgment normal.     No results found for any visits on 03/13/22.  Assessment & Plan     Problem List Items Addressed This Visit       Digestive   Acid reflux - Primary    Chronic GERD without esophagitis or hemorrhage Continue Prilosec 40 mg to assist Has been taking Tums PRN for evening complaints if needed following spicy food intake       Relevant Medications   omeprazole (PRILOSEC) 40 MG capsule   Return if symptoms worsen or fail to improve.     Vonna Kotyk, FNP, have reviewed all documentation for this visit. The documentation on 03/13/22 for the exam, diagnosis, procedures, and orders are all accurate and complete.  Gwyneth Sprout, [place] [phone] (phone) [phone] (fax)  [place]

## 2022-03-16 ENCOUNTER — Telehealth: Payer: Self-pay | Admitting: Family Medicine

## 2022-03-16 NOTE — Telephone Encounter (Signed)
Refilled refused. A prescription was sent to [facility] for a Qty.90 R:3 confirmed with the pharmacist and prescription was picked up.

## 2022-03-16 NOTE — Telephone Encounter (Signed)
[place] [facility] faxed refill request for the following medications:   omeprazole (PRILOSEC) 40 MG capsule    Please advise.

## 2022-03-17 ENCOUNTER — Encounter: Payer: PPO | Admitting: Family Medicine

## 2022-03-30 DIAGNOSIS — J069 Acute upper respiratory infection, unspecified: Secondary | ICD-10-CM | POA: Diagnosis not present

## 2022-03-30 DIAGNOSIS — E559 Vitamin D deficiency, unspecified: Secondary | ICD-10-CM | POA: Diagnosis not present

## 2022-03-30 DIAGNOSIS — J42 Unspecified chronic bronchitis: Secondary | ICD-10-CM | POA: Diagnosis not present

## 2022-03-30 DIAGNOSIS — K219 Gastro-esophageal reflux disease without esophagitis: Secondary | ICD-10-CM | POA: Diagnosis not present

## 2022-06-22 ENCOUNTER — Encounter: Payer: Self-pay | Admitting: Family Medicine

## 2022-06-22 DIAGNOSIS — Z1231 Encounter for screening mammogram for malignant neoplasm of breast: Secondary | ICD-10-CM

## 2022-06-23 ENCOUNTER — Encounter: Payer: Self-pay | Admitting: Family Medicine

## 2022-06-29 ENCOUNTER — Other Ambulatory Visit: Payer: Self-pay | Admitting: Family Medicine

## 2022-06-29 DIAGNOSIS — N644 Mastodynia: Secondary | ICD-10-CM

## 2022-07-01 ENCOUNTER — Ambulatory Visit
Admission: RE | Admit: 2022-07-01 | Discharge: 2022-07-01 | Disposition: A | Discharge status: Home / Self Care | Payer: Medicare HMO | Source: Ambulatory Visit | Attending: Family Medicine | Admitting: Family Medicine

## 2022-07-01 DIAGNOSIS — N644 Mastodynia: Secondary | ICD-10-CM

## 2023-07-06 ENCOUNTER — Other Ambulatory Visit: Payer: Self-pay | Admitting: Family Medicine

## 2023-07-06 DIAGNOSIS — Z1231 Encounter for screening mammogram for malignant neoplasm of breast: Secondary | ICD-10-CM

## 2023-08-07 LAB — EXTERNAL GENERIC LAB PROCEDURE

## 2023-08-07 LAB — COLOGUARD

## 2023-08-17 ENCOUNTER — Ambulatory Visit
Admission: RE | Admit: 2023-08-17 | Discharge: 2023-08-17 | Disposition: A | Discharge status: Home / Self Care | Source: Ambulatory Visit | Attending: Family Medicine | Admitting: Family Medicine

## 2023-08-17 DIAGNOSIS — Z1231 Encounter for screening mammogram for malignant neoplasm of breast: Secondary | ICD-10-CM | POA: Insufficient documentation

## 2023-08-22 LAB — COLOGUARD: COLOGUARD: POSITIVE — AB

## 2023-08-22 LAB — EXTERNAL GENERIC LAB PROCEDURE: COLOGUARD: POSITIVE — AB

## 2023-10-20 ENCOUNTER — Encounter: Payer: Self-pay | Admitting: Internal Medicine

## 2023-10-27 ENCOUNTER — Encounter: Payer: Self-pay | Admitting: Internal Medicine

## 2023-10-27 ENCOUNTER — Encounter
Admission: RE | Disposition: A | Discharge status: Home / Self Care | Payer: Self-pay | Source: Home / Self Care | Attending: Internal Medicine

## 2023-10-27 ENCOUNTER — Other Ambulatory Visit: Payer: Self-pay

## 2023-10-27 ENCOUNTER — Ambulatory Visit: Admitting: Anesthesiology

## 2023-10-27 ENCOUNTER — Ambulatory Visit
Admission: RE | Admit: 2023-10-27 | Discharge: 2023-10-27 | Disposition: A | Discharge status: Home / Self Care | Attending: Internal Medicine | Admitting: Internal Medicine

## 2023-10-27 DIAGNOSIS — Z87891 Personal history of nicotine dependence: Secondary | ICD-10-CM | POA: Diagnosis not present

## 2023-10-27 DIAGNOSIS — R195 Other fecal abnormalities: Secondary | ICD-10-CM | POA: Diagnosis not present

## 2023-10-27 DIAGNOSIS — Z1211 Encounter for screening for malignant neoplasm of colon: Secondary | ICD-10-CM | POA: Diagnosis not present

## 2023-10-27 DIAGNOSIS — K219 Gastro-esophageal reflux disease without esophagitis: Secondary | ICD-10-CM | POA: Diagnosis not present

## 2023-10-27 DIAGNOSIS — R1314 Dysphagia, pharyngoesophageal phase: Secondary | ICD-10-CM | POA: Diagnosis present

## 2023-10-27 DIAGNOSIS — Q398 Other congenital malformations of esophagus: Secondary | ICD-10-CM | POA: Insufficient documentation

## 2023-10-27 DIAGNOSIS — K449 Diaphragmatic hernia without obstruction or gangrene: Secondary | ICD-10-CM | POA: Diagnosis not present

## 2023-10-27 DIAGNOSIS — K2100 Gastro-esophageal reflux disease with esophagitis, without bleeding: Secondary | ICD-10-CM | POA: Diagnosis not present

## 2023-10-27 DIAGNOSIS — K640 First degree hemorrhoids: Secondary | ICD-10-CM | POA: Diagnosis not present

## 2023-10-27 DIAGNOSIS — Z79899 Other long term (current) drug therapy: Secondary | ICD-10-CM | POA: Diagnosis not present

## 2023-10-27 HISTORY — PX: COLONOSCOPY: SHX5424

## 2023-10-27 HISTORY — PX: MALONEY DILATION: SHX5535

## 2023-10-27 HISTORY — PX: ESOPHAGOGASTRODUODENOSCOPY: SHX5428

## 2023-10-27 SURGERY — COLONOSCOPY
Anesthesia: General

## 2023-10-27 MED ORDER — LIDOCAINE HCL (CARDIAC) PF 100 MG/5ML IV SOSY
PREFILLED_SYRINGE | INTRAVENOUS | Status: DC | PRN
  Administered 2023-10-27: 100 mg via INTRAVENOUS

## 2023-10-27 MED ORDER — PROPOFOL 10 MG/ML IV BOLUS
INTRAVENOUS | Status: DC | PRN
  Administered 2023-10-27 (×2): 50 mg via INTRAVENOUS
  Administered 2023-10-27: 100 mg via INTRAVENOUS
  Administered 2023-10-27: 50 mg via INTRAVENOUS
  Administered 2023-10-27: 150 mg via INTRAVENOUS
  Administered 2023-10-27 (×3): 50 mg via INTRAVENOUS

## 2023-10-27 MED ORDER — SODIUM CHLORIDE 0.9 % IV SOLN
INTRAVENOUS | Status: DC
  Administered 2023-10-27: 500 mL via INTRAVENOUS

## 2023-10-27 MED ORDER — GLYCOPYRROLATE 0.2 MG/ML IJ SOLN
INTRAMUSCULAR | Status: AC
  Filled 2023-10-27: qty 1

## 2023-10-27 MED ORDER — LIDOCAINE HCL (PF) 2 % IJ SOLN
INTRAMUSCULAR | Status: AC
Start: 2023-10-27 — End: 2023-10-27
  Filled 2023-10-27: qty 5

## 2023-10-27 MED ORDER — SODIUM CHLORIDE 0.9 % IV SOLN: INTRAVENOUS | Status: DC | PRN

## 2023-10-27 MED ORDER — GLYCOPYRROLATE 0.2 MG/ML IJ SOLN
INTRAMUSCULAR | Status: DC | PRN
  Administered 2023-10-27: .2 mg via INTRAVENOUS

## 2023-10-27 NOTE — Anesthesia Postprocedure Evaluation (Signed)
 Anesthesia Post Note  Patient: Amanda Carpenter  Procedure(s) Performed: COLONOSCOPY EGD (ESOPHAGOGASTRODUODENOSCOPY) DILATION, ESOPHAGUS, USING MALONEY DILATOR  Patient location during evaluation: Endoscopy Anesthesia Type: General Level of consciousness: awake and alert Pain management: pain level controlled Vital Signs Assessment: post-procedure vital signs reviewed and stable Respiratory status: spontaneous breathing, nonlabored ventilation, respiratory function stable and patient connected to nasal cannula oxygen Cardiovascular status: blood pressure returned to baseline and stable Postop Assessment: no apparent nausea or vomiting Anesthetic complications: no  No notable events documented.   Last Vitals:  Vitals:   10/27/23 1213 10/27/23 1356  BP: 137/63 (!) 149/98  Pulse: 64 87  Resp: 15 19  Temp: (!) 36 C (!) 35.8 C  SpO2: 98% 99%    Last Pain:  Vitals:   10/27/23 1356  TempSrc: Temporal  PainSc: Asleep                 Enrique Harvest

## 2023-10-27 NOTE — Anesthesia Preprocedure Evaluation (Signed)
 Anesthesia Evaluation  Patient identified by MRN, date of birth, ID band Patient awake    Reviewed: Allergy & Precautions, NPO status , Patient's Chart, lab work & pertinent test results  History of Anesthesia Complications Negative for: history of anesthetic complications  Airway Mallampati: III  TM Distance: >3 FB Neck ROM: full    Dental no notable dental hx.    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     Comment:  Resection and RT     Reproductive/Obstetrics negative OB ROS                             Anesthesia Physical Anesthesia Plan  ASA: 2  Anesthesia Plan: General   Post-op Pain Management: Minimal or no pain anticipated   Induction: Intravenous  PONV Risk Score and Plan: 2 and Propofol  infusion and TIVA  Airway Management Planned: Natural Airway and Nasal Cannula  Additional Equipment:   Intra-op Plan:   Post-operative Plan:   Informed Consent: I have reviewed the patients History and Physical, chart, labs and discussed the procedure including the risks, benefits and alternatives for the proposed anesthesia with the patient or authorized representative who has indicated his/her understanding and acceptance.     Dental Advisory Given  Plan Discussed with: Anesthesiologist, CRNA and Surgeon  Anesthesia Plan Comments: (Patient consented for risks of anesthesia including but not limited to:  - adverse reactions to medications - risk of airway placement if required - damage to eyes, teeth, lips or other oral mucosa - nerve damage due to positioning  - sore throat or hoarseness - Damage to heart, brain, nerves,  lungs, other parts of body or loss of life  Patient voiced understanding and assent.)       Anesthesia Quick Evaluation

## 2023-10-27 NOTE — H&P (Signed)
 Outpatient short stay form Pre-procedure 10/27/2023 1:12 PM Tierra Divelbiss K. Corky Diener, M.D.  Primary Physician: Gustavo Leigh, M.D.  Reason for visit:  Dysphagia, unspecified type  Gastroesophageal reflux disease, unspecified whether esophagitis present  Positive colorectal cancer screening using Cologuard test   History of present illness:  Ms. Randol reports having colonoscopy in [place] approximately 10 years ago that she reports was normal. She denies any recent GI symptoms. She currently is having a bowel movement most days and denies any constipation, diarrhea, melena, rectal bleeding. No lower abdominal pain.  She does endorse issues with dysphagia, endorses 2 types of dysphagia, first to foods such as chicken and rice lodging in her upper esophageal area and will have to regurgitate these at times. She also endorses regurgitation to liquids at other times. She denies any pill dysphagia.  She reports that omeprazole  40 mg controls her reflux well, she does have symptoms if she eats foods such as tomatoes late in the evening, she uses ginger chews which helps. She denies any nausea or vomiting.  She denies family hx colon polyps or colon cancer.     Current Facility-Administered Medications:    0.9 %  sodium chloride  infusion, , Intravenous, Continuous, [place], Smita Lesh K, MD, Last Rate: 20 mL/hr at 10/27/23 1222, 500 mL at 10/27/23 1222  Medications Prior to Admission  Medication Sig Dispense Refill Last Dose/Taking   UNABLE TO FIND Med Name: Sulfur Crystals- 1.5 teaspoons in the morning   10/26/2023   vitamin B-12 (CYANOCOBALAMIN) 500 MCG tablet Take 500 mcg by mouth daily.   10/25/2023   calcium citrate (CALCITRATE - DOSED IN MG ELEMENTAL CALCIUM) 950 MG tablet Take 200 mg of elemental calcium by mouth daily.   10/25/2023   ELDERBERRY PO Take 2 tablets by mouth daily at 6 (six) AM. Gummies   10/25/2023   fluticasone (FLONASE) 50 MCG/ACT nasal spray Place 1 spray into both nostrils  daily. As needed      omeprazole  (PRILOSEC) 40 MG capsule Take 1 capsule (40 mg total) by mouth daily. 90 capsule 3 10/25/2023   OVER THE COUNTER MEDICATION Goli gummies 1 a day (Patient not taking: Reported on 10/27/2023)   Not Taking   Vitamin D , Ergocalciferol , 2000 units CAPS Take 2,000 Units by mouth daily.   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    Physical Exam  Gen: Alert, oriented. Appears stated age.  HEENT: [place]/AT. PERRLA. Lungs: CTA, no wheezes. CV: RR nl S1, S2. Abd: soft, benign, no masses. BS+ Ext: No edema. Pulses 2+    Planned procedures: Proceed with EGD and colonoscopy. The patient understands the nature of the planned procedure, indications, risks, alternatives and potential complications including but not limited to bleeding, infection, perforation, damage to internal organs and possible oversedation/side effects from anesthesia. The patient agrees and gives consent to proceed.  Please refer to procedure notes for findings, recommendations and patient disposition/instructions.     Tabbatha Bordelon K. Corky Diener, M.D. Gastroenterology 10/27/2023  1:12 PM

## 2023-10-27 NOTE — Transfer of Care (Signed)
 Immediate Anesthesia Transfer of Care Note  Patient: Amanda Carpenter  Procedure(s) Performed: COLONOSCOPY EGD (ESOPHAGOGASTRODUODENOSCOPY) DILATION, ESOPHAGUS, USING MALONEY DILATOR  Patient Location: PACU and Endoscopy Unit  Anesthesia Type:MAC  Level of Consciousness: sedated  Airway & Oxygen Therapy: Patient Spontanous BreathingOral Airway in place  Post-op Assessment: Report given to RN and Post -op Vital signs reviewed and stable  Post vital signs: Reviewed and stable  Last Vitals:  Vitals Value Taken Time  BP    Temp    Pulse    Resp    SpO2      Last Pain:  Vitals:   10/27/23 1213  TempSrc: Tympanic  PainSc: 0-No pain      Patients Stated Pain Goal: 7 (10/27/23 1213)  Complications: No notable events documented.

## 2023-10-27 NOTE — Interval H&P Note (Signed)
 History and Physical Interval Note:  10/27/2023 1:21 PM  Amanda Carpenter  has presented today for surgery, with the diagnosis of Dysphagia, unspecified type (R13.10) Gastroesophageal reflux disease, unspecified whether esophagitis present (K21.9) Positive colorectal cancer screening using Cologuard test (R19.5).  The various methods of treatment have been discussed with the patient and family. After consideration of risks, benefits and other options for treatment, the patient has consented to  Procedure(s): COLONOSCOPY (N/A) EGD (ESOPHAGOGASTRODUODENOSCOPY) (N/A) as a surgical intervention.  The patient's history has been reviewed, patient examined, no change in status, stable for surgery.  I have reviewed the patient's chart and labs.  Questions were answered to the patient's satisfaction.     [place], Jaymes Hang

## 2023-10-27 NOTE — Op Note (Signed)
 [facility] Patient Name: Amanda Carpenter Procedure Date: 10/27/2023 1:16 PM MRN: [id number] Account #: [id number] Date of Birth: 07/21/1945 Admit Type: Outpatient Age: 78 Room: [facility] ENDO ROOM 3 Gender: Female Note Status: Finalized Instrument Name: Upper Endoscope [id number] Procedure:             Upper GI endoscopy Indications:           Pharyngeal phase dysphagia, Esophageal dysphagia,                         Gastro-esophageal reflux disease Providers:             Himani Corona K. Corky Diener MD, MD Referring MD:          [name], MD (Referring MD) Medicines:             Propofol  per Anesthesia Complications:         No immediate complications. Estimated blood loss: None. Procedure:             Pre-Anesthesia Assessment:                        - The risks and benefits of the procedure and the                         sedation options and risks were discussed with the                         patient. All questions were answered and informed                         consent was obtained.                        - Patient identification and proposed procedure were                         verified prior to the procedure by the nurse. The                         procedure was verified in the procedure room.                        - ASA Grade Assessment: III - A patient with severe                         systemic disease.                        - After reviewing the risks and benefits, the patient                         was deemed in satisfactory condition to undergo the                         procedure.                        After obtaining informed consent, the endoscope was  passed under direct vision. Throughout the procedure,                         the patient's blood pressure, pulse, and oxygen                         saturations were monitored continuously. The Endoscope                         was introduced through the  mouth, and advanced to the                         third part of duodenum. The upper GI endoscopy was                         accomplished without difficulty. The patient tolerated                         the procedure well. Findings:      Moderate tortuosity of the mid to distal esophagus was noted compatible       with a diagnosis of Presbyesophagus.      The exam of the esophagus was otherwise normal.      The scope was withdrawn. Dilation was performed in the distal esophagus       with a Maloney dilator with mild resistance at 54 Fr.      A large hiatal hernia was present.      The exam of the stomach was otherwise normal.      The examined duodenum was normal. Impression:            - Large hiatal hernia.                        - Normal examined duodenum.                        - Dilation performed in the distal esophagus.                        - No specimens collected. Recommendation:        - Monitor results to esophageal dilation                        - Consider UGI series to evaluate for possible                         paraesophageal hernia that may contribute to recurrent                         dysphagia.                        - Proceed with colonoscopy Procedure Code(s):     --- Professional ---                        [phone], Esophagogastroduodenoscopy, flexible,                         transoral; diagnostic, including collection of  specimen(s) by brushing or washing, when performed                         (separate procedure)                        43450, Dilation of esophagus, by unguided sound or                         bougie, single or multiple passes Diagnosis Code(s):     --- Professional ---                        K21.9, Gastro-esophageal reflux disease without                         esophagitis                        R13.14, Dysphagia, pharyngoesophageal phase                        R13.13, Dysphagia, pharyngeal phase                         K44.9, Diaphragmatic hernia without obstruction or                         gangrene CPT copyright 2022 American Medical Association. All rights reserved. The codes documented in this report are preliminary and upon coder review may  be revised to meet current compliance requirements. Cassie Click MD, MD 10/27/2023 1:34:41 PM This report has been signed electronically. Number of Addenda: 0 Note Initiated On: 10/27/2023 1:16 PM Estimated Blood Loss:  Estimated blood loss: none.      [facility]

## 2023-10-27 NOTE — Op Note (Signed)
 [facility] Patient Name: Amanda Carpenter Procedure Date: 10/27/2023 1:15 PM MRN: [id number] Account #: [id number] Date of Birth: [birth year] Admit Type: Outpatient Age: 78 Room: [facility] ROOM 3 Gender: Female Note Status: Finalized Instrument Name: Colonscope [id number] Procedure:             Colonoscopy Indications:           Positive Cologuard test Providers:             Traeson Dusza K. Corky Diener MD, MD Referring MD:          [name], MD (Referring MD) Medicines:             Propofol  per Anesthesia Complications:         No immediate complications. Estimated blood loss: None. Procedure:             Pre-Anesthesia Assessment:                        - The risks and benefits of the procedure and the                         sedation options and risks were discussed with the                         patient. All questions were answered and informed                         consent was obtained.                        - Patient identification and proposed procedure were                         verified prior to the procedure by the nurse. The                         procedure was verified in the procedure room.                        - ASA Grade Assessment: III - A patient with severe                         systemic disease.                        - After reviewing the risks and benefits, the patient                         was deemed in satisfactory condition to undergo the                         procedure.                        After obtaining informed consent, the colonoscope was                         passed under direct vision. Throughout the procedure,  the patient's blood pressure, pulse, and oxygen                         saturations were monitored continuously. The                         Colonoscope was introduced through the anus and                         advanced to the the cecum, identified by appendiceal                          orifice and ileocecal valve. The colonoscopy was                         somewhat difficult due to unusual anatomy. Successful                         completion of the procedure was aided by straightening                         and shortening the scope to obtain bowel loop                         reduction. The patient tolerated the procedure well.                         The quality of the bowel preparation was adequate. The                         ileocecal valve, appendiceal orifice, and rectum were                         photographed. Findings:      The perianal and digital rectal examinations were normal. Pertinent       negatives include normal sphincter tone and no palpable rectal lesions.      Non-bleeding internal hemorrhoids were found during retroflexion. The       hemorrhoids were Grade I (internal hemorrhoids that do not prolapse).      The exam was otherwise without abnormality. Impression:            - Non-bleeding internal hemorrhoids.                        - The examination was otherwise normal.                        - No specimens collected. Recommendation:        - Monitor results to esophageal dilation                        - Patient has a contact number available for                         emergencies. The signs and symptoms of potential                         delayed complications were discussed with the patient.  Return to normal activities tomorrow. Written                         discharge instructions were provided to the patient.                        - Resume previous diet.                        - Continue present medications.                        - No repeat colonoscopy due to current age (35 years                         or older).                        - You do NOT require further colon cancer screening                         measures (Annual stool testing (i.e. hemoccult, FIT,                          cologuard), sigmoidoscopy, colonoscopy or CT                         colonography). You should share this recommendation                         with your Primary Care provider.                        - Follow up with Amanda Plank, PA-C at [facility]. [phone].                        - Telephone GI office to schedule appointment in 3                         months.                        - The findings and recommendations were discussed with                         the patient. Procedure Code(s):     --- Professional ---                        [phone], Colonoscopy, flexible; diagnostic, including                         collection of specimen(s) by brushing or washing, when                         performed (separate procedure) Diagnosis Code(s):     --- Professional ---  R19.5, Other fecal abnormalities                        K64.0, First degree hemorrhoids CPT copyright 2022 American [employer] Association. All rights reserved. The codes documented in this report are preliminary and upon coder review may  be revised to meet current compliance requirements. Cassie Click MD, MD 10/27/2023 1:53:03 PM This report has been signed electronically. Number of Addenda: 0 Note Initiated On: 10/27/2023 1:15 PM Scope Withdrawal Time: 0 hours 6 minutes 23 seconds  Total Procedure Duration: 0 hours 14 minutes 28 seconds  Estimated Blood Loss:  Estimated blood loss: none.      [facility]

## 2023-11-08 ENCOUNTER — Encounter: Payer: Self-pay | Admitting: Internal Medicine

## 2024-02-07 ENCOUNTER — Telehealth: Payer: Self-pay | Admitting: Surgery

## 2024-02-07 NOTE — Telephone Encounter (Signed)
 FYI. Received a referral to schedule the patient an appt for Hiatal Hernia with [facility] but pt said when I called her that she was still uncertain if she was going to do it or not just wanted to make sure that we had the ref if she did decide to go ahead with it.

## 2024-02-08 NOTE — Telephone Encounter (Signed)
 Lvm with that message and asked her to call us .

## 2024-02-10 ENCOUNTER — Other Ambulatory Visit: Payer: Self-pay | Admitting: Gastroenterology

## 2024-02-10 DIAGNOSIS — K449 Diaphragmatic hernia without obstruction or gangrene: Secondary | ICD-10-CM

## 2024-02-18 ENCOUNTER — Ambulatory Visit
Admission: RE | Admit: 2024-02-18 | Discharge: 2024-02-18 | Disposition: A | Discharge status: Home / Self Care | Source: Ambulatory Visit | Attending: Gastroenterology | Admitting: Gastroenterology

## 2024-02-18 DIAGNOSIS — K449 Diaphragmatic hernia without obstruction or gangrene: Secondary | ICD-10-CM | POA: Diagnosis present

## 2024-02-18 MED ORDER — IOHEXOL 350 MG/ML SOLN
75.0000 mL | Freq: Once | INTRAVENOUS | Status: AC | PRN
  Administered 2024-02-18: 75 mL via INTRAVENOUS

## 2024-03-02 NOTE — H&P (View-Only) (Signed)
 [address]       [place] [address]             [phone]                    [facility] Medical Record #[id number] Date of Birth: 1945/07/06  Referring: Mevelyn Romero [facility] Primary Care: Bertrum Charlie CROME, MD Primary Cardiologist: None  Chief Complaint:   No chief complaint on file.   History of Present Illness:    Amanda [facility] 33 78 y.o. 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 Dessa   COLONOSCOPY N/A 10/27/2023   Procedure: COLONOSCOPY;  Surgeon: Toledo, Ladell POUR, MD;  Location: [facility];  Service: Gastroenterology;  Laterality: N/A;   COLONSCOPY AND ENDOSCOPY  2014   ESOPHAGOGASTRODUODENOSCOPY N/A 10/27/2023   Procedure: EGD (ESOPHAGOGASTRODUODENOSCOPY);  Surgeon: Toledo, Ladell POUR, MD;  Location: [facility];  Service: Gastroenterology;  Laterality: N/A;   KNEE ARTHROPLASTY Left 04/2014   MALONEY DILATION  10/27/2023   Procedure: DILATION, ESOPHAGUS, USING MALONEY DILATOR;  Surgeon: Aundria, Ladell POUR, MD;  Location: [facility];  Service: Gastroenterology;;   TOTAL KNEE ARTHROPLASTY Right 04/20/2016   Procedure: RIGHT TOTAL KNEE ARTHROPLASTY;  Surgeon: Dempsey Moan, MD;   Location: [facility];  Service: Orthopedics;  Laterality: Right;   vocal cord cancer  1987   Resection and RT    Family History  Problem Relation Age of Onset   Healthy Mother    Hypertension Mother    Hyperlipidemia Mother    Heart attack Father 31   Breast cancer Sister 53     Social History   Tobacco Use  Smoking Status Former   Current packs/day: 0.00   Average packs/day: 1 pack/day for 10.0 years (10.0 ttl pk-yrs)   Types: Cigarettes   Start date: 05/12/1975   Quit date: 05/11/1985   Years since quitting: 38.8  Smokeless Tobacco Never    Social History   Substance and Sexual Activity  Alcohol Use Not Currently     Allergies  Allergen Reactions   Motrin [Ibuprofen]     COLLAGENOUS COLITIS    Current Outpatient Medications  Medication Sig Dispense Refill   calcium citrate (CALCITRATE - DOSED IN MG ELEMENTAL CALCIUM) 950 MG tablet Take 200 mg of elemental calcium by mouth daily.     ELDERBERRY PO Take 2 tablets by mouth daily at 6 (six) AM. Gummies     fluticasone (FLONASE) 50 MCG/ACT nasal spray Place 1 spray into both nostrils daily. As needed     omeprazole  (PRILOSEC) 40 MG capsule Take 1 capsule (40 mg total) by mouth daily. 90 capsule 3   OVER THE  COUNTER MEDICATION Goli gummies 1 a day (Patient not taking: Reported on 10/27/2023)     UNABLE TO FIND Med Name: Sulfur Crystals- 1.5 teaspoons in the morning     vitamin B-12 (CYANOCOBALAMIN) 500 MCG tablet Take 500 mcg by mouth daily.     Vitamin D , Ergocalciferol , 2000 units CAPS Take 2,000 Units by mouth daily.     No current facility-administered medications for this visit.    ROS   PHYSICAL EXAMINATION: There were no vitals taken for this visit. Physical Exam  Diagnostic Studies & Laboratory data:    CT Scan: *** EGD: *** Manometry: *** Path: ***      I have independently reviewed the above radiology studies  and reviewed the findings with the patient.   Recent Lab Findings: Lab Results   Component Value Date   WBC 8.7 03/11/2021   HGB 12.2 03/11/2021   HCT 37.6 03/11/2021   PLT 309 03/11/2021   GLUCOSE 113 (H) 03/11/2021   CHOL 188 03/11/2021   TRIG 130 03/11/2021   HDL 57 03/11/2021   LDLCALC 108 (H) 03/11/2021   ALT 16 03/11/2021   AST 22 03/11/2021   NA 141 03/11/2021   K 4.2 03/11/2021   CL 104 03/11/2021   CREATININE 1.01 (H) 03/11/2021   BUN 15 03/11/2021   CO2 24 03/11/2021   TSH 3.360 03/11/2021   INR 0.96 04/10/2016   HGBA1C 6.4 (H) 03/11/2021      Assessment / Plan:   78yo female with moderate sized hiatal hernia on scan.  It appears much larger on upper endoscopy performed in June of this year.  ***     I  spent {CHL ONC TIME VISIT - DTPQU:[id number]} with  the patient face to face and greater then 50% of the time was spent in counseling and coordination of care.    Amanda [facility] 03/02/2024 3:08 PM

## 2024-03-02 NOTE — Progress Notes (Unsigned)
 [address]       [place] [address]             [phone]                    [facility] Medical Record #[id number] Date of Birth: 1945/07/06  Referring: Mevelyn Romero KATHEE DEVONNA Primary Care: Bertrum Charlie CROME, MD Primary Cardiologist: None  Chief Complaint:   No chief complaint on file.   History of Present Illness:    Amanda Carpenter 33 78 y.o. 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 Dessa   COLONOSCOPY N/A 10/27/2023   Procedure: COLONOSCOPY;  Surgeon: Toledo, Ladell POUR, MD;  Location: [facility];  Service: Gastroenterology;  Laterality: N/A;   COLONSCOPY AND ENDOSCOPY  2014   ESOPHAGOGASTRODUODENOSCOPY N/A 10/27/2023   Procedure: EGD (ESOPHAGOGASTRODUODENOSCOPY);  Surgeon: Toledo, Ladell POUR, MD;  Location: [facility];  Service: Gastroenterology;  Laterality: N/A;   KNEE ARTHROPLASTY Left 04/2014   MALONEY DILATION  10/27/2023   Procedure: DILATION, ESOPHAGUS, USING MALONEY DILATOR;  Surgeon: Aundria, Ladell POUR, MD;  Location: [facility];  Service: Gastroenterology;;   TOTAL KNEE ARTHROPLASTY Right 04/20/2016   Procedure: RIGHT TOTAL KNEE ARTHROPLASTY;  Surgeon: Dempsey Moan, MD;   Location: [facility];  Service: Orthopedics;  Laterality: Right;   vocal cord cancer  1987   Resection and RT    Family History  Problem Relation Age of Onset   Healthy Mother    Hypertension Mother    Hyperlipidemia Mother    Heart attack Father 31   Breast cancer Sister 53     Social History   Tobacco Use  Smoking Status Former   Current packs/day: 0.00   Average packs/day: 1 pack/day for 10.0 years (10.0 ttl pk-yrs)   Types: Cigarettes   Start date: 05/12/1975   Quit date: 05/11/1985   Years since quitting: 38.8  Smokeless Tobacco Never    Social History   Substance and Sexual Activity  Alcohol Use Not Currently     Allergies  Allergen Reactions   Motrin [Ibuprofen]     COLLAGENOUS COLITIS    Current Outpatient Medications  Medication Sig Dispense Refill   calcium citrate (CALCITRATE - DOSED IN MG ELEMENTAL CALCIUM) 950 MG tablet Take 200 mg of elemental calcium by mouth daily.     ELDERBERRY PO Take 2 tablets by mouth daily at 6 (six) AM. Gummies     fluticasone (FLONASE) 50 MCG/ACT nasal spray Place 1 spray into both nostrils daily. As needed     omeprazole  (PRILOSEC) 40 MG capsule Take 1 capsule (40 mg total) by mouth daily. 90 capsule 3   OVER THE  COUNTER MEDICATION Goli gummies 1 a day (Patient not taking: Reported on 10/27/2023)     UNABLE TO FIND Med Name: Sulfur Crystals- 1.5 teaspoons in the morning     vitamin B-12 (CYANOCOBALAMIN) 500 MCG tablet Take 500 mcg by mouth daily.     Vitamin D , Ergocalciferol , 2000 units CAPS Take 2,000 Units by mouth daily.     No current facility-administered medications for this visit.    ROS   PHYSICAL EXAMINATION: There were no vitals taken for this visit. Physical Exam  Diagnostic Studies & Laboratory data:    CT Scan: *** EGD: *** Manometry: *** Path: ***      I have independently reviewed the above radiology studies  and reviewed the findings with the patient.   Recent Lab Findings: Lab Results   Component Value Date   WBC 8.7 03/11/2021   HGB 12.2 03/11/2021   HCT 37.6 03/11/2021   PLT 309 03/11/2021   GLUCOSE 113 (H) 03/11/2021   CHOL 188 03/11/2021   TRIG 130 03/11/2021   HDL 57 03/11/2021   LDLCALC 108 (H) 03/11/2021   ALT 16 03/11/2021   AST 22 03/11/2021   NA 141 03/11/2021   K 4.2 03/11/2021   CL 104 03/11/2021   CREATININE 1.01 (H) 03/11/2021   BUN 15 03/11/2021   CO2 24 03/11/2021   TSH 3.360 03/11/2021   INR 0.96 04/10/2016   HGBA1C 6.4 (H) 03/11/2021      Assessment / Plan:   78yo female with moderate sized hiatal hernia on scan.  It appears much larger on upper endoscopy performed in June of this year.  ***     I  spent {CHL ONC TIME VISIT - DTPQU:[id number]} with  the patient face to face and greater then 50% of the time was spent in counseling and coordination of care.    Linnie MALVA Rayas 03/02/2024 3:08 PM

## 2024-03-03 ENCOUNTER — Other Ambulatory Visit: Payer: Self-pay | Admitting: *Deleted

## 2024-03-03 ENCOUNTER — Encounter: Payer: Self-pay | Admitting: *Deleted

## 2024-03-03 ENCOUNTER — Ambulatory Visit
Attending: Thoracic Surgery (Cardiothoracic Vascular Surgery) | Admitting: Thoracic Surgery (Cardiothoracic Vascular Surgery)

## 2024-03-03 VITALS — BP 166/75 | HR 64 | Resp 18 | Ht 62.0 in | Wt 250.0 lb

## 2024-03-03 DIAGNOSIS — K449 Diaphragmatic hernia without obstruction or gangrene: Secondary | ICD-10-CM

## 2024-03-17 ENCOUNTER — Encounter (HOSPITAL_COMMUNITY): Payer: Self-pay

## 2024-03-17 NOTE — Progress Notes (Addendum)
 Surgical Instructions   Your procedure is scheduled on Wednesday March 22, 2024. Report to [facility] Main Entrance A at 10:15 A.M., then check in with the Admitting office. Any questions or running late day of surgery: call [phone]  Questions prior to your surgery date: call [phone], Monday-Friday, 8am-4pm. If you experience any cold or flu symptoms such as cough, fever, chills, shortness of breath, etc. between now and your scheduled surgery, please notify us  at the above number.     Remember:  Do not eat or drink after midnight the night before your surgery  Take these medicines the morning of surgery with A SIP OF WATER : None    May take these medicines IF NEEDED: None    One week prior to surgery, STOP taking any Aspirin (unless otherwise instructed by your surgeon) Aleve, Naproxen, Ibuprofen, Motrin, Advil, Goody's, BC's, all herbal medications, fish oil, and non-prescription vitamins.  WHAT DO I DO ABOUT MY DIABETES MEDICATION?   Do not take oral diabetes medicines (pills) the morning of surgery.  The day of surgery, do not take other diabetes injectables, including Byetta (exenatide), Bydureon (exenatide ER), Victoza (liraglutide), or Trulicity (dulaglutide).   HOW TO MANAGE YOUR DIABETES BEFORE AND AFTER SURGERY  Why is it important to control my blood sugar before and after surgery? Improving blood sugar levels before and after surgery helps healing and can limit problems. A way of improving blood sugar control is eating a healthy diet by:  Eating less sugar and carbohydrates  Increasing activity/exercise  Talking with your doctor about reaching your blood sugar goals High blood sugars (greater than 180 mg/dL) can raise your risk of infections and slow your recovery, so you will need to focus on controlling your diabetes during the weeks before surgery. Make sure that the doctor who takes care of your diabetes knows about your planned surgery  including the date and location.  How do I manage my blood sugar before surgery? Check your blood sugar at least 4 times a day, starting 2 days before surgery, to make sure that the level is not too high or low.  Check your blood sugar the morning of your surgery when you wake up and every 2 hours until you get to the Short Stay unit.  If your blood sugar is less than 70 mg/dL, you will need to treat for low blood sugar: Do not take insulin. Treat a low blood sugar (less than 70 mg/dL) with  cup of clear juice (cranberry or apple), 4 glucose tablets, OR glucose gel. Recheck blood sugar in 15 minutes after treatment (to make sure it is greater than 70 mg/dL). If your blood sugar is not greater than 70 mg/dL on recheck, call [phone] for further instructions. Report your blood sugar to the short stay nurse when you get to Short Stay.  If you are admitted to the hospital after surgery: Your blood sugar will be checked by the staff and you will probably be given insulin after surgery (instead of oral diabetes medicines) to make sure you have good blood sugar levels. The goal for blood sugar control after surgery is 80-180 mg/dL.                      Do NOT Smoke (Tobacco/Vaping) for 24 hours prior to your procedure.  If you use a CPAP at night, you may bring your mask/headgear for your overnight stay.   You will be asked to remove any contacts, glasses, piercing's, hearing  aid's, dentures/partials prior to surgery. Please bring cases for these items if needed.    Patients discharged the day of surgery will not be allowed to drive home, and someone needs to stay with them for 24 hours.  SURGICAL WAITING ROOM VISITATION Patients may have no more than 2 support people in the waiting area - these visitors may rotate.   Pre-op nurse will coordinate an appropriate time for 1 ADULT support person, who may not rotate, to accompany patient in pre-op.  Children under the age of 85 must have an  adult with them who is not the patient and must remain in the main waiting area with an adult.  If the patient needs to stay at the hospital during part of their recovery, the visitor guidelines for inpatient rooms apply.  Please refer to the [facility] website for the visitor guidelines for any additional information.   If you received a COVID test during your pre-op visit  it is requested that you wear a mask when out in public, stay away from anyone that may not be feeling well and notify your surgeon if you develop symptoms. If you have been in contact with anyone that has tested positive in the last 10 days please notify you surgeon.      Pre-operative CHG Bathing Instructions   You can play a key role in reducing the risk of infection after surgery. Your skin needs to be as free of germs as possible. You can reduce the number of germs on your skin by washing with CHG (chlorhexidine  gluconate) soap before surgery. CHG is an antiseptic soap that kills germs and continues to kill germs even after washing.   DO NOT use if you have an allergy to chlorhexidine /CHG or antibacterial soaps. If your skin becomes reddened or irritated, stop using the CHG and notify one of our RNs at [phone].              TAKE A SHOWER THE NIGHT BEFORE SURGERY   Please keep in mind the following:  DO NOT shave, including legs and underarms, 48 hours prior to surgery.   Place clean sheets on your bed the night before surgery Use a clean washcloth (not used since being washed) for shower. DO NOT sleep with pet's night before surgery.  CHG Shower Instructions:  Wash your face and private area with normal soap. If you choose to wash your hair, wash first with your normal shampoo.  After you use shampoo/soap, rinse your hair and body thoroughly to remove shampoo/soap residue.  Turn the water  OFF and apply half the bottle of CHG soap to a CLEAN washcloth.  Apply CHG soap ONLY FROM YOUR NECK DOWN TO YOUR TOES  (washing for 3-5 minutes)  DO NOT use CHG soap on face, private areas, open wounds, or sores.  Pay special attention to the area where your surgery is being performed.  If you are having back surgery, having someone wash your back for you may be helpful. Wait 2 minutes after CHG soap is applied, then you may rinse off the CHG soap.  Pat dry with a clean towel  Put on clean pajamas    Additional instructions for the day of surgery: If you choose, you may shower the morning of surgery with an antibacterial soap.  DO NOT APPLY any lotions, deodorants or perfumes.   Do not wear jewelry or makeup Do not wear nail polish, gel polish, artificial nails, or any other type of covering on natural  nails (fingers and toes) Do not bring valuables to the hospital. [facility] is not responsible for valuables/personal belongings. Put on clean/comfortable clothes.  Please brush your teeth.  Ask your nurse before applying any prescription medications to the skin.

## 2024-03-20 ENCOUNTER — Ambulatory Visit (HOSPITAL_COMMUNITY)
Admission: RE | Admit: 2024-03-20 | Discharge: 2024-03-20 | Disposition: A | Discharge status: Home / Self Care | Source: Ambulatory Visit | Attending: Thoracic Surgery (Cardiothoracic Vascular Surgery) | Admitting: Thoracic Surgery (Cardiothoracic Vascular Surgery)

## 2024-03-20 ENCOUNTER — Encounter (HOSPITAL_COMMUNITY)
Admission: RE | Admit: 2024-03-20 | Discharge: 2024-03-20 | Disposition: A | Discharge status: Home / Self Care | Source: Ambulatory Visit | Attending: Thoracic Surgery (Cardiothoracic Vascular Surgery) | Admitting: Thoracic Surgery (Cardiothoracic Vascular Surgery)

## 2024-03-20 ENCOUNTER — Encounter (HOSPITAL_COMMUNITY): Payer: Self-pay

## 2024-03-20 ENCOUNTER — Other Ambulatory Visit: Payer: Self-pay

## 2024-03-20 VITALS — BP 131/60 | HR 70 | Temp 98.0°F | Resp 18 | Ht 63.0 in | Wt 257.1 lb

## 2024-03-20 DIAGNOSIS — K449 Diaphragmatic hernia without obstruction or gangrene: Secondary | ICD-10-CM | POA: Insufficient documentation

## 2024-03-20 DIAGNOSIS — Z01818 Encounter for other preprocedural examination: Secondary | ICD-10-CM | POA: Insufficient documentation

## 2024-03-20 HISTORY — DX: Type 2 diabetes mellitus without complications: E11.9

## 2024-03-20 LAB — CBC
HCT: 40.1 % (ref 36.0–46.0)
Hemoglobin: 12.5 g/dL (ref 12.0–15.0)
MCH: 26.2 pg (ref 26.0–34.0)
MCHC: 31.2 g/dL (ref 30.0–36.0)
MCV: 83.9 fL (ref 80.0–100.0)
Platelets: 296 K/uL (ref 150–400)
RBC: 4.78 MIL/uL (ref 3.87–5.11)
RDW: 15.8 % — ABNORMAL HIGH (ref 11.5–15.5)
WBC: 9.2 K/uL (ref 4.0–10.5)
nRBC: 0 % (ref 0.0–0.2)

## 2024-03-20 LAB — PROTIME-INR
INR: 1.1 (ref 0.8–1.2)
Prothrombin Time: 14.5 s (ref 11.4–15.2)

## 2024-03-20 LAB — COMPREHENSIVE METABOLIC PANEL WITH GFR
ALT: 21 U/L (ref 0–44)
AST: 25 U/L (ref 15–41)
Albumin: 3.7 g/dL (ref 3.5–5.0)
Alkaline Phosphatase: 104 U/L (ref 38–126)
Anion gap: 12 (ref 5–15)
BUN: 16 mg/dL (ref 8–23)
CO2: 27 mmol/L (ref 22–32)
Calcium: 9.1 mg/dL (ref 8.9–10.3)
Chloride: 100 mmol/L (ref 98–111)
Creatinine, Ser: 1.01 mg/dL — ABNORMAL HIGH (ref 0.44–1.00)
GFR, Estimated: 57 mL/min — ABNORMAL LOW (ref >60)
Glucose, Bld: 118 mg/dL — ABNORMAL HIGH (ref 70–99)
Potassium: 3.8 mmol/L (ref 3.5–5.1)
Sodium: 139 mmol/L (ref 135–145)
Total Bilirubin: 0.6 mg/dL (ref 0.0–1.2)
Total Protein: 7.4 g/dL (ref 6.5–8.1)

## 2024-03-20 LAB — URINALYSIS, ROUTINE W REFLEX MICROSCOPIC
Bilirubin Urine: NEGATIVE
Glucose, UA: NEGATIVE mg/dL
Hgb urine dipstick: NEGATIVE
Ketones, ur: NEGATIVE mg/dL
Leukocytes,Ua: NEGATIVE
Nitrite: NEGATIVE
Protein, ur: NEGATIVE mg/dL
Specific Gravity, Urine: 1.017 (ref 1.005–1.030)
pH: 5 (ref 5.0–8.0)

## 2024-03-20 LAB — TYPE AND SCREEN
ABO/RH(D): O POS
Antibody Screen: NEGATIVE

## 2024-03-20 LAB — SURGICAL PCR SCREEN
MRSA, PCR: NEGATIVE
Staphylococcus aureus: NEGATIVE

## 2024-03-20 LAB — GLUCOSE, CAPILLARY: Glucose-Capillary: 130 mg/dL — ABNORMAL HIGH (ref 70–99)

## 2024-03-20 LAB — APTT: aPTT: 39 s — ABNORMAL HIGH (ref 24–36)

## 2024-03-20 NOTE — Progress Notes (Signed)
 PCP - Dr. Charlie Forte Cardiologist -   PPM/ICD - denies Device Orders - na Rep Notified - na  Chest x-ray - PAT, 03/20/2024 EKG - PAT, 03/20/2024 Stress Test -  ECHO -  Cardiac Cath -   Sleep Study - denies CPAP - na  Type II diabetic A1C 6.7 on 06/23/2023, current sample drawn in PAT. Blood sugar 130 at PAT appointment. Denies having diabetes, takes no medications for diabetes Fasting Blood Sugar:  na Checks Blood Sugar:na  Last dose of GLP1 agonist-  denies GLP1 instructions: na  Blood Thinner Instructions: denies Aspirin Instructions:denies  ERAS Protcol - NPO  Anesthesia review: Yes. DM, having a thoracoscopy  Patient denies shortness of breath, fever, cough and chest pain at PAT appointment   All instructions explained to the patient, with a verbal understanding of the material. Patient agrees to go over the instructions while at home for a better understanding. Patient also instructed to self quarantine after being tested for COVID-19. The opportunity to ask questions was provided.

## 2024-03-22 ENCOUNTER — Inpatient Hospital Stay (HOSPITAL_COMMUNITY): Payer: Self-pay | Admitting: Anesthesiology

## 2024-03-22 ENCOUNTER — Encounter (HOSPITAL_COMMUNITY): Payer: Self-pay | Admitting: Thoracic Surgery (Cardiothoracic Vascular Surgery)

## 2024-03-22 ENCOUNTER — Inpatient Hospital Stay (HOSPITAL_COMMUNITY)
Admission: AD | Admit: 2024-03-22 | Discharge: 2024-03-24 | DRG: 327 | Disposition: A | Discharge status: Home / Self Care | Attending: Thoracic Surgery (Cardiothoracic Vascular Surgery) | Admitting: Thoracic Surgery (Cardiothoracic Vascular Surgery)

## 2024-03-22 ENCOUNTER — Encounter (HOSPITAL_COMMUNITY)
Admission: AD | Disposition: A | Discharge status: Home / Self Care | Payer: Self-pay | Source: Home / Self Care | Attending: Thoracic Surgery (Cardiothoracic Vascular Surgery)

## 2024-03-22 ENCOUNTER — Ambulatory Visit (HOSPITAL_COMMUNITY)

## 2024-03-22 ENCOUNTER — Other Ambulatory Visit: Payer: Self-pay

## 2024-03-22 DIAGNOSIS — K449 Diaphragmatic hernia without obstruction or gangrene: Principal | ICD-10-CM | POA: Diagnosis present

## 2024-03-22 DIAGNOSIS — E66813 Obesity, class 3: Secondary | ICD-10-CM | POA: Diagnosis present

## 2024-03-22 DIAGNOSIS — Z9889 Other specified postprocedural states: Principal | ICD-10-CM

## 2024-03-22 DIAGNOSIS — I10 Essential (primary) hypertension: Secondary | ICD-10-CM | POA: Diagnosis present

## 2024-03-22 DIAGNOSIS — Z96653 Presence of artificial knee joint, bilateral: Secondary | ICD-10-CM | POA: Diagnosis present

## 2024-03-22 DIAGNOSIS — Z923 Personal history of irradiation: Secondary | ICD-10-CM

## 2024-03-22 DIAGNOSIS — M159 Polyosteoarthritis, unspecified: Secondary | ICD-10-CM | POA: Diagnosis present

## 2024-03-22 DIAGNOSIS — Z6841 Body Mass Index (BMI) 40.0 and over, adult: Secondary | ICD-10-CM

## 2024-03-22 DIAGNOSIS — Z886 Allergy status to analgesic agent status: Secondary | ICD-10-CM

## 2024-03-22 DIAGNOSIS — E119 Type 2 diabetes mellitus without complications: Secondary | ICD-10-CM

## 2024-03-22 DIAGNOSIS — K219 Gastro-esophageal reflux disease without esophagitis: Secondary | ICD-10-CM | POA: Diagnosis present

## 2024-03-22 DIAGNOSIS — Z87891 Personal history of nicotine dependence: Secondary | ICD-10-CM

## 2024-03-22 DIAGNOSIS — Z01818 Encounter for other preprocedural examination: Secondary | ICD-10-CM

## 2024-03-22 DIAGNOSIS — R1310 Dysphagia, unspecified: Secondary | ICD-10-CM | POA: Diagnosis present

## 2024-03-22 DIAGNOSIS — Z803 Family history of malignant neoplasm of breast: Secondary | ICD-10-CM

## 2024-03-22 DIAGNOSIS — Z83438 Family history of other disorder of lipoprotein metabolism and other lipidemia: Secondary | ICD-10-CM

## 2024-03-22 DIAGNOSIS — Z8249 Family history of ischemic heart disease and other diseases of the circulatory system: Secondary | ICD-10-CM | POA: Diagnosis not present

## 2024-03-22 DIAGNOSIS — Z8719 Personal history of other diseases of the digestive system: Secondary | ICD-10-CM

## 2024-03-22 DIAGNOSIS — Z8521 Personal history of malignant neoplasm of larynx: Secondary | ICD-10-CM

## 2024-03-22 HISTORY — PX: ESOPHAGOGASTRODUODENOSCOPY: SHX5428

## 2024-03-22 HISTORY — PX: XI ROBOTIC ASSISTED PARAESOPHAGEAL HERNIA REPAIR: SHX6871

## 2024-03-22 LAB — GLUCOSE, CAPILLARY: Glucose-Capillary: 181 mg/dL — ABNORMAL HIGH (ref 70–99)

## 2024-03-22 LAB — CBC
HCT: 36.5 % (ref 36.0–46.0)
Hemoglobin: 11.5 g/dL — ABNORMAL LOW (ref 12.0–15.0)
MCH: 26.1 pg (ref 26.0–34.0)
MCHC: 31.5 g/dL (ref 30.0–36.0)
MCV: 82.8 fL (ref 80.0–100.0)
Platelets: 266 K/uL (ref 150–400)
RBC: 4.41 MIL/uL (ref 3.87–5.11)
RDW: 15.8 % — ABNORMAL HIGH (ref 11.5–15.5)
WBC: 11.5 K/uL — ABNORMAL HIGH (ref 4.0–10.5)
nRBC: 0 % (ref 0.0–0.2)

## 2024-03-22 LAB — CREATININE, SERUM
Creatinine, Ser: 0.96 mg/dL (ref 0.44–1.00)
GFR, Estimated: >60 mL/min (ref >60)

## 2024-03-22 SURGERY — REPAIR, HERNIA, PARAESOPHAGEAL, ROBOT-ASSISTED
Anesthesia: General | Site: Chest

## 2024-03-22 MED ORDER — FENTANYL CITRATE (PF) 50 MCG/ML IJ SOSY: 25.0000 ug | PREFILLED_SYRINGE | INTRAMUSCULAR | Status: DC | PRN

## 2024-03-22 MED ORDER — ACETAMINOPHEN 650 MG RE SUPP
650.0000 mg | Freq: Four times a day (QID) | RECTAL | Status: DC | PRN
Start: 2024-03-22 — End: 2024-03-23

## 2024-03-22 MED ORDER — LIDOCAINE 2% (20 MG/ML) 5 ML SYRINGE
INTRAMUSCULAR | Status: AC
Start: 2024-03-22 — End: 2024-03-22
  Filled 2024-03-22: qty 5

## 2024-03-22 MED ORDER — HYDROMORPHONE HCL 1 MG/ML IJ SOLN
INTRAMUSCULAR | Status: DC | PRN
  Administered 2024-03-22 (×2): .25 mg via INTRAVENOUS

## 2024-03-22 MED ORDER — CHLORHEXIDINE GLUCONATE 0.12 % MT SOLN: 15.0000 mL | Freq: Once | OROMUCOSAL | Status: AC

## 2024-03-22 MED ORDER — ONDANSETRON HCL 4 MG/2ML IJ SOLN
INTRAMUSCULAR | Status: AC
Start: 2024-03-22 — End: 2024-03-22
  Filled 2024-03-22: qty 2

## 2024-03-22 MED ORDER — LIDOCAINE 2% (20 MG/ML) 5 ML SYRINGE
INTRAMUSCULAR | Status: DC | PRN
  Administered 2024-03-22: 40 mg via INTRAVENOUS

## 2024-03-22 MED ORDER — BUPIVACAINE LIPOSOME 1.3 % IJ SUSP: INTRAMUSCULAR | Status: DC | PRN

## 2024-03-22 MED ORDER — ACETAMINOPHEN 325 MG PO TABS: 650.0000 mg | ORAL_TABLET | Freq: Four times a day (QID) | ORAL | Status: DC | PRN

## 2024-03-22 MED ORDER — ONDANSETRON HCL 4 MG/2ML IJ SOLN: 4.0000 mg | Freq: Four times a day (QID) | INTRAMUSCULAR | Status: DC | PRN

## 2024-03-22 MED ORDER — EPHEDRINE 5 MG/ML INJ
INTRAVENOUS | Status: AC
Start: 2024-03-22 — End: 2024-03-22
  Filled 2024-03-22: qty 5

## 2024-03-22 MED ORDER — CHLORHEXIDINE GLUCONATE 0.12 % MT SOLN
OROMUCOSAL | Status: AC
Start: 2024-03-22 — End: 2024-03-22
  Administered 2024-03-22: 15 mL via OROMUCOSAL
  Filled 2024-03-22: qty 15

## 2024-03-22 MED ORDER — PHENYLEPHRINE 80 MCG/ML (10ML) SYRINGE FOR IV PUSH (FOR BLOOD PRESSURE SUPPORT)
PREFILLED_SYRINGE | INTRAVENOUS | Status: AC
  Filled 2024-03-22: qty 10

## 2024-03-22 MED ORDER — 0.9 % SODIUM CHLORIDE (POUR BTL) OPTIME
TOPICAL | Status: DC | PRN
  Administered 2024-03-22: 2000 mL

## 2024-03-22 MED ORDER — MIDAZOLAM HCL (PF) 2 MG/2ML IJ SOLN: 0.5000 mg | Freq: Once | INTRAMUSCULAR | Status: DC | PRN

## 2024-03-22 MED ORDER — ONDANSETRON 4 MG PO TBDP: 4.0000 mg | ORAL_TABLET | Freq: Four times a day (QID) | ORAL | Status: DC | PRN

## 2024-03-22 MED ORDER — PROPOFOL 10 MG/ML IV BOLUS
INTRAVENOUS | Status: DC | PRN
  Administered 2024-03-22: 150 mg via INTRAVENOUS

## 2024-03-22 MED ORDER — CEFAZOLIN SODIUM-DEXTROSE 2-4 GM/100ML-% IV SOLN
2.0000 g | INTRAVENOUS | Status: AC
  Administered 2024-03-22: 2 g via INTRAVENOUS

## 2024-03-22 MED ORDER — BUPIVACAINE HCL (PF) 0.5 % IJ SOLN
INTRAMUSCULAR | Status: AC
  Filled 2024-03-22: qty 30

## 2024-03-22 MED ORDER — SUGAMMADEX SODIUM 200 MG/2ML IV SOLN
INTRAVENOUS | Status: DC | PRN
  Administered 2024-03-22: 200 mg via INTRAVENOUS

## 2024-03-22 MED ORDER — PHENYLEPHRINE 80 MCG/ML (10ML) SYRINGE FOR IV PUSH (FOR BLOOD PRESSURE SUPPORT)
PREFILLED_SYRINGE | INTRAVENOUS | Status: DC | PRN
Start: 2024-03-22 — End: 2024-03-22
  Administered 2024-03-22 (×2): 80 ug via INTRAVENOUS

## 2024-03-22 MED ORDER — ONDANSETRON HCL 4 MG/2ML IJ SOLN
INTRAMUSCULAR | Status: DC | PRN
  Administered 2024-03-22: 4 mg via INTRAVENOUS

## 2024-03-22 MED ORDER — PANTOPRAZOLE SODIUM 40 MG IV SOLR
40.0000 mg | Freq: Every day | INTRAVENOUS | Status: DC
  Administered 2024-03-23 (×2): 40 mg via INTRAVENOUS
  Filled 2024-03-22 (×2): qty 10

## 2024-03-22 MED ORDER — DEXAMETHASONE SOD PHOSPHATE PF 10 MG/ML IJ SOLN
INTRAMUSCULAR | Status: DC | PRN
  Administered 2024-03-22: 5 mg via INTRAVENOUS

## 2024-03-22 MED ORDER — LACTATED RINGERS IV SOLN: INTRAVENOUS | Status: DC

## 2024-03-22 MED ORDER — OXYCODONE HCL 5 MG PO TABS: 5.0000 mg | ORAL_TABLET | Freq: Once | ORAL | Status: DC | PRN

## 2024-03-22 MED ORDER — HYDROMORPHONE HCL 1 MG/ML IJ SOLN
INTRAMUSCULAR | Status: AC
  Filled 2024-03-22: qty 0.5

## 2024-03-22 MED ORDER — ENOXAPARIN SODIUM 40 MG/0.4ML IJ SOSY
40.0000 mg | PREFILLED_SYRINGE | INTRAMUSCULAR | Status: DC
  Administered 2024-03-23: 40 mg via SUBCUTANEOUS
  Filled 2024-03-22: qty 0.4

## 2024-03-22 MED ORDER — PROPOFOL 10 MG/ML IV BOLUS
INTRAVENOUS | Status: AC
Start: 2024-03-22 — End: 2024-03-22
  Filled 2024-03-22: qty 20

## 2024-03-22 MED ORDER — ORAL CARE MOUTH RINSE: 15.0000 mL | Freq: Once | OROMUCOSAL | Status: AC

## 2024-03-22 MED ORDER — ROCURONIUM BROMIDE 10 MG/ML (PF) SYRINGE
PREFILLED_SYRINGE | INTRAVENOUS | Status: DC | PRN
  Administered 2024-03-22: 40 mg via INTRAVENOUS
  Administered 2024-03-22: 20 mg via INTRAVENOUS
  Administered 2024-03-22: 60 mg via INTRAVENOUS
  Administered 2024-03-22: 10 mg via INTRAVENOUS

## 2024-03-22 MED ORDER — ROCURONIUM BROMIDE 10 MG/ML (PF) SYRINGE
PREFILLED_SYRINGE | INTRAVENOUS | Status: AC
Start: 2024-03-22 — End: 2024-03-22
  Filled 2024-03-22: qty 10

## 2024-03-22 MED ORDER — HYDROMORPHONE HCL 1 MG/ML IJ SOLN: 0.2500 mg | INTRAMUSCULAR | Status: DC | PRN

## 2024-03-22 MED ORDER — CEFAZOLIN SODIUM-DEXTROSE 2-4 GM/100ML-% IV SOLN
INTRAVENOUS | Status: AC
  Filled 2024-03-22: qty 100

## 2024-03-22 MED ORDER — HYDRALAZINE HCL 20 MG/ML IJ SOLN: 10.0000 mg | INTRAMUSCULAR | Status: DC | PRN

## 2024-03-22 MED ORDER — OXYCODONE HCL 5 MG/5ML PO SOLN: 5.0000 mg | Freq: Once | ORAL | Status: DC | PRN

## 2024-03-22 MED ORDER — CEFAZOLIN SODIUM-DEXTROSE 2-4 GM/100ML-% IV SOLN
2.0000 g | Freq: Three times a day (TID) | INTRAVENOUS | Status: AC
  Administered 2024-03-22: 2 g via INTRAVENOUS
  Filled 2024-03-22: qty 100

## 2024-03-22 MED ORDER — FENTANYL CITRATE (PF) 250 MCG/5ML IJ SOLN
INTRAMUSCULAR | Status: DC | PRN
Start: 2024-03-22 — End: 2024-03-22
  Administered 2024-03-22: 250 ug via INTRAVENOUS

## 2024-03-22 MED ORDER — PHENYLEPHRINE HCL-NACL 20-0.9 MG/250ML-% IV SOLN
INTRAVENOUS | Status: DC | PRN
  Administered 2024-03-22: 50 ug/min via INTRAVENOUS
  Administered 2024-03-22: 25 ug/min via INTRAVENOUS

## 2024-03-22 MED ORDER — BUPIVACAINE LIPOSOME 1.3 % IJ SUSP
INTRAMUSCULAR | Status: AC
Start: 2024-03-22 — End: 2024-03-22
  Filled 2024-03-22: qty 20

## 2024-03-22 MED ORDER — FENTANYL CITRATE (PF) 250 MCG/5ML IJ SOLN
INTRAMUSCULAR | Status: AC
  Filled 2024-03-22: qty 5

## 2024-03-22 MED ORDER — ROCURONIUM BROMIDE 10 MG/ML (PF) SYRINGE
PREFILLED_SYRINGE | INTRAVENOUS | Status: AC
  Filled 2024-03-22: qty 10

## 2024-03-22 SURGICAL SUPPLY — 71 items
BLADE SURG 11 STRL SS (BLADE) ×2 IMPLANT
BUTTON OLYMPUS DEFENDO 5 PIECE (MISCELLANEOUS) ×2 IMPLANT
CANISTER SUCTION 3000ML PPV (SUCTIONS) ×4 IMPLANT
CATH FOLEY 2WAY SLVR 5CC 14FR (CATHETERS) IMPLANT
CNTNR URN SCR LID CUP LEK RST (MISCELLANEOUS) ×2 IMPLANT
DEFOGGER SCOPE WARM SEASHARP (MISCELLANEOUS) ×2 IMPLANT
DERMABOND ADVANCED .7 DNX12 (GAUZE/BANDAGES/DRESSINGS) ×2 IMPLANT
DEVICE SUTURE ENDOST 10MM (ENDOMECHANICALS) IMPLANT
DRAPE ARM DVNC X/XI (DISPOSABLE) ×8 IMPLANT
DRAPE COLUMN DVNC XI (DISPOSABLE) ×2 IMPLANT
DRAPE CV SPLIT W-CLR ANES SCRN (DRAPES) ×2 IMPLANT
DRAPE INCISE IOBAN 66X45 STRL (DRAPES) IMPLANT
DRAPE SURG ORHT 6 SPLT 77X108 (DRAPES) ×2 IMPLANT
DRIVER NDL LRG 8 DVNC XI (INSTRUMENTS) IMPLANT
DRIVER NDL MEGA SUTCUT DVNCXI (INSTRUMENTS) IMPLANT
DRIVER NDLE LRG 8 DVNC XI (INSTRUMENTS) ×2 IMPLANT
DRIVER NDLE MEGA SUTCUT DVNCXI (INSTRUMENTS) ×2 IMPLANT
ELECTRODE REM PT RTRN 9FT ADLT (ELECTROSURGICAL) ×2 IMPLANT
FELT TEFLON 1X6 (MISCELLANEOUS) IMPLANT
FORCEPS BPLR LNG DVNC XI (INSTRUMENTS) IMPLANT
FORCEPS CADIERE DVNC XI (FORCEP) IMPLANT
GAUZE SPONGE 4X4 12PLY STRL (GAUZE/BANDAGES/DRESSINGS) ×2 IMPLANT
GLOVE BIO SURGEON STRL SZ7 (GLOVE) ×2 IMPLANT
GLOVE BIO SURGEON STRL SZ7.5 (GLOVE) ×6 IMPLANT
GOWN STRL REUS W/ TWL LRG LVL3 (GOWN DISPOSABLE) ×2 IMPLANT
GOWN STRL REUS W/ TWL XL LVL3 (GOWN DISPOSABLE) ×4 IMPLANT
GOWN STRL REUS W/TWL 2XL LVL3 (GOWN DISPOSABLE) ×2 IMPLANT
GOWN STRL SURGICAL XL XLNG (GOWN DISPOSABLE) ×6 IMPLANT
GRASPER TIP-UP FEN DVNC XI (INSTRUMENTS) IMPLANT
HEMOSTAT SURGICEL 2X14 (HEMOSTASIS) ×2 IMPLANT
IRRIGATOR SUCT 8 DISP DVNC XI (IRRIGATION / IRRIGATOR) IMPLANT
IV 0.9% NACL 1000 ML (IV SOLUTION) IMPLANT
KIT BASIN OR (CUSTOM PROCEDURE TRAY) ×2 IMPLANT
KIT TURNOVER KIT B (KITS) ×2 IMPLANT
MARKER SKIN DUAL TIP RULER LAB (MISCELLANEOUS) ×2 IMPLANT
MESH OVITEX 1S RESORB 10X12 6L (Mesh General) ×2 IMPLANT
MESH OVITEX 1S RESORB 6X10 6L (Mesh General) IMPLANT
NDL HYPO 22X1.5 SAFETY MO (MISCELLANEOUS) ×2 IMPLANT
NEEDLE HYPO 22X1.5 SAFETY MO (MISCELLANEOUS) ×2 IMPLANT
OBTURATOR OPTICALSTD 8 DVNC (TROCAR) ×2 IMPLANT
OIL SILICONE PENTAX (PARTS (SERVICE/REPAIRS)) IMPLANT
PACK CHEST (CUSTOM PROCEDURE TRAY) ×2 IMPLANT
PAD ARMBOARD POSITIONER FOAM (MISCELLANEOUS) ×4 IMPLANT
PORT ACCESS TROCAR AIRSEAL 12 (TROCAR) IMPLANT
SEAL UNIV 5-12 XI (MISCELLANEOUS) ×8 IMPLANT
SEALER SYNCHRO 8 IS4000 DVNC (MISCELLANEOUS) IMPLANT
SET TRI-LUMEN FLTR TB AIRSEAL (TUBING) ×2 IMPLANT
SOLN 0.9% NACL POUR BTL 1000ML (IV SOLUTION) ×4 IMPLANT
SOLN STERILE WATER BTL 1000 ML (IV SOLUTION) ×2 IMPLANT
SUT ETHIBOND 0 36 GRN (SUTURE) ×4 IMPLANT
SUT SILK 1 MH (SUTURE) ×2 IMPLANT
SUT SURGIDAC NAB ES-9 0 48 120 (SUTURE) IMPLANT
SUT VIC AB 2-0 CT1 18 (SUTURE) ×2 IMPLANT
SUT VIC AB 2-0 CT1 TAPERPNT 27 (SUTURE) IMPLANT
SUT VIC AB 3-0 SH 27X BRD (SUTURE) ×4 IMPLANT
SUT VICRYL 0 TIES 12 18 (SUTURE) IMPLANT
SUT VICRYL 0 UR6 27IN ABS (SUTURE) ×4 IMPLANT
SUTURE V-LC BRB 180 2/0GR6GS22 (SUTURE) IMPLANT
SYR 20ML ECCENTRIC (SYRINGE) ×2 IMPLANT
SYSTEM RETRIEVAL ANCHOR 8 (MISCELLANEOUS) IMPLANT
SYSTEM SAHARA CHEST DRAIN ATS (WOUND CARE) IMPLANT
TOWEL GREEN STERILE (TOWEL DISPOSABLE) ×2 IMPLANT
TOWEL GREEN STERILE FF (TOWEL DISPOSABLE) ×2 IMPLANT
TRAY FOLEY MTR SLVR 16FR STAT (SET/KITS/TRAYS/PACK) ×2 IMPLANT
TRAY WAYNE PNEUMOTHORAX 14X18 (TRAY / TRAY PROCEDURE) IMPLANT
TROCAR PORT AIRSEAL 8X120 (TROCAR) IMPLANT
TROCAR XCEL BLADELESS 5X75MML (TROCAR) ×2 IMPLANT
TROCAR XCEL NON-BLD 5MMX100MML (ENDOMECHANICALS) IMPLANT
TUBE CONNECTING 20X1/4 (TUBING) ×2 IMPLANT
TUBING ENDO SMARTCAP (MISCELLANEOUS) ×2 IMPLANT
UNDERPAD 30X36 HEAVY ABSORB (UNDERPADS AND DIAPERS) ×2 IMPLANT

## 2024-03-22 NOTE — Hospital Course (Addendum)
  Referring: Amanda Carpenter Primary Care: Bertrum Charlie CROME, MD Primary Cardiologist: None   History of Present Illness:    Amanda Carpenter [age] y.o. female presents for surgical evaluation of a moderate hiatal hernia.  She states that she has had reflux for a number of years, and of late she has been having dysphagia and regurgitation of food when she coughs.  She is unable to lay flat.  She denies any odynophagia.  Her weight has been stable.  The scan shows it to be a moderate-sized hiatal hernia however it appears much larger on upper endoscopy which was performed in June of this year.  Following following further discussion of all risks and benefits Dr. Shyrl recommended proceeding with robotic assisted hiatal hernia repair.  She was admitted this hospitalization for the procedure.  Hospital course:  Patient was admitted electively on 03/22/2024 at which time she was taken the operating room and underwent a robotic assisted laparoscopic hernia repair by Dr. Shyrl.  She tolerated the procedure well and was taken to the postanesthesia care unit in stable condition.  Postoperative hospital course:  The patient has done well.  She has maintained good pain control.  He has a very minor expected acute blood loss anemia.  She is voiding well and has maintained normal renal function.  Swallow study was done on postop day 1 and showed a small residual hiatus hernia and no evidence of leak.  She was placed on  a dysphagia 1 diet. She tolerated it well.

## 2024-03-22 NOTE — Anesthesia Preprocedure Evaluation (Addendum)
 Anesthesia Evaluation  Patient identified by MRN, date of birth, ID band Patient awake    Reviewed: Allergy & Precautions, NPO status , Patient's Chart, lab work & pertinent test results  History of Anesthesia Complications Negative for: history of anesthetic complications  Airway Mallampati: I  TM Distance: >3 FB Neck ROM: Full    Dental  (+) Dental Advisory Given   Pulmonary former smoker H/o vocal cord cancer: XRT   breath sounds clear to auscultation       Cardiovascular negative cardio ROS  Rhythm:Regular Rate:Normal     Neuro/Psych negative neurological ROS     GI/Hepatic Neg liver ROS,GERD  Medicated and Poorly Controlled,,  Endo/Other  diabetes (glu 130)  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.5  Renal/GU negative Renal ROS     Musculoskeletal  (+) Arthritis ,    Abdominal   Peds  Hematology Hb 12.5, plt 296k   Anesthesia Other Findings   Reproductive/Obstetrics                              Anesthesia Physical Anesthesia Plan  ASA: 3  Anesthesia Plan: General   Post-op Pain Management: Tylenol  PO (pre-op)*   Induction: Intravenous  PONV Risk Score and Plan: 3 and Ondansetron , Dexamethasone  and Treatment may vary due to age or medical condition  Airway Management Planned: Oral ETT  Additional Equipment: None  Intra-op Plan:   Post-operative Plan: Extubation in OR  Informed Consent: I have reviewed the patients History and Physical, chart, labs and discussed the procedure including the risks, benefits and alternatives for the proposed anesthesia with the patient or authorized representative who has indicated his/her understanding and acceptance.     Dental advisory given  Plan Discussed with: CRNA and Surgeon  Anesthesia Plan Comments:         Anesthesia Quick Evaluation

## 2024-03-22 NOTE — Brief Op Note (Signed)
 03/22/2024  3:38 PM  PATIENT:  Amanda Carpenter  78 y.o. female  PRE-OPERATIVE DIAGNOSIS:  PARAESOPHAGEAL HERNIA  POST-OPERATIVE DIAGNOSIS:  PARAESOPHAGEAL HERNIA  PROCEDURE:  Procedure(s): REPAIR, HERNIA, PARAESOPHAGEAL, ROBOT-ASSISTED USING OVITEX REINFORCED TISSUE MATRIX (N/A) EGD (ESOPHAGOGASTRODUODENOSCOPY) (N/A)  SURGEON:  Surgeons and Role:    * Lightfoot, Linnie KIDD, MD - Primary  PHYSICIAN ASSISTANT: William Schake PA-C  ANESTHESIA:   general AND LOCAL  EBL:  50 mL   BLOOD ADMINISTERED:none  DRAINS: none   LOCAL MEDICATIONS USED:  BUPIVICAINE  and OTHER EXPAREL   SPECIMEN:  No Specimen  DISPOSITION OF SPECIMEN:  N/A  COUNTS:  YES  TOURNIQUET:  * No tourniquets in log *  DICTATION: .Dragon Dictation  PLAN OF CARE: Admit to inpatient   PATIENT DISPOSITION:  PACU - hemodynamically stable.   Delay start of Pharmacological VTE agent (>24hrs) due to surgical blood loss or risk of bleeding: no

## 2024-03-22 NOTE — Op Note (Incomplete)
 [address] [name] [address]             [phone]      03/22/2024  Patient:  Amanda Carpenter Pre-Op Dx: ***   Post-op Dx:  *** Procedure: - Esophagoscopy - Robotic assisted laparoscopy - Paraesophageal hernia repair *** Ovitex mesh pledgets   Surgeon and Role:      * Lightfoot, Linnie KIDD, MD - Primary  Assistant: ***, PA-C  An experienced assistant was required given the complexity of this surgery and the standard of surgical care. The assistant was needed for exposure, dissection, suctioning, retraction of delicate tissues and sutures, instrument exchange and for overall help during this procedure.   Anesthesia  general EBL:  ***ml Blood Administration: *** Specimen:  ***   Counts: correct   Indications: *** Findings: Tortuous esophagus.  Large retroperitoneal lipoma.  Hernia sack was stuck to the right pleura.  This was opened for full mobilization.  The was a small gastrotomy that was oversewn with a V-lock suture in 2 layers.    Operative Technique: After the risks, benefits and alternatives were thoroughly discussed, the patient was brought to the operative theatre.  Anesthesia was induced, and the esophagoscope was passed through the oropharynx down to the stomach.  The scope was retroflexed and the hiatal hernia was clearly evident.  The scope was pulled back and the mucosal surface of the esophagus was visualized.    ***.  The scope was then parked at 25 cm from the incisors.  The patient was then prepped and draped in normal sterile fashion.  An appropriate surgical pause was performed, and pre-operative antibiotics were dosed accordingly.  We began with a 1 cm incision 15 cm caudad from the xiphoid and slightly lateral to the umbilicus.  Using an Optiview we entered the peritoneal space.  The abdomen was then insufflated with CO2.  3 other robotic ports were placed to triangulate the hiatus.  Another 12 mm port was placed in place at  the level of the umbilicus laterally for an assistant port and another 5 mm trocar was placed in the right lower quadrant for liver retractor.  The patient was then placed in steep reverse Trendelenburg and the liver was elevated to expose the esophageal hiatus.  And then the robot was docked.  We began by dividing the gastrohepatic ligament to expose the right diaphragmatic crus and then dissected the hernia sac in a clockwise fashion to mobilize there the stomach and esophagus.  We then divided the short gastrics and moved towards the right crus and completed our dissection along the esophageal hiatus.  A Penrose drain was then used to encircle the the esophagus and we continued our dissection up into the mediastinum.  Once we had achieved 3 to 4 cm of intra-abdominal esophagus we then proceeded to reapproximate the crura with 0 Ethibond sutures in an interrupted fashion.  ***.  The gastroscope was passed down through the lower esophageal sphincter into the stomach and would act as our bougie during this repair.  ***.  Next the stomach was passed ***to the esophagus and ***fundoplication was performed.  An air leak test was performed using the gastroscope.  No leak was evident.  The liver retractor was removed and all ports were removed under direct visualization.  The skin and soft tissue were closed with absorbable suture    The patient tolerated the procedure without any immediate complications, and was transferred  to the PACU in stable condition.  Amanda Carpenter

## 2024-03-22 NOTE — Interval H&P Note (Signed)
 History and Physical Interval Note:  03/22/2024 11:21 AM  Amanda Carpenter  has presented today for surgery, with the diagnosis of PARAESOPHAGEAL HERNIA.  The various methods of treatment have been discussed with the patient and family. After consideration of risks, benefits and other options for treatment, the patient has consented to  Procedure(s): REPAIR, HERNIA, PARAESOPHAGEAL, ROBOT-ASSISTED (N/A) EGD (ESOPHAGOGASTRODUODENOSCOPY) (N/A) as a surgical intervention.  The patient's history has been reviewed, patient examined, no change in status, stable for surgery.  I have reviewed the patient's chart and labs.  Questions were answered to the patient's satisfaction.     Kayla Weekes MALVA Rayas

## 2024-03-22 NOTE — Anesthesia Postprocedure Evaluation (Signed)
 Anesthesia Post Note  Patient: Loss Adjuster, Chartered  Procedure(s) Performed: REPAIR, HERNIA, PARAESOPHAGEAL, ROBOT-ASSISTED USING OVITEX REINFORCED TISSUE MATRIX (Chest) EGD (ESOPHAGOGASTRODUODENOSCOPY)     Patient location during evaluation: PACU Anesthesia Type: General Level of consciousness: awake Pain management: pain level controlled Vital Signs Assessment: post-procedure vital signs reviewed and stable Respiratory status: spontaneous breathing, nonlabored ventilation and respiratory function stable Cardiovascular status: blood pressure returned to baseline and stable Postop Assessment: no apparent nausea or vomiting Anesthetic complications: no   No notable events documented.  Last Vitals:  Vitals:   03/22/24 1933 03/22/24 1935  BP: (!) 145/118 (!) 179/78  Pulse: 85 84  Resp: 19 19  Temp: 36.8 C 36.8 C  SpO2: 93% 92%    Last Pain:  Vitals:   03/22/24 1935  TempSrc: Oral  PainSc:                  Delon Aisha Arch

## 2024-03-22 NOTE — Discharge Summary (Signed)
Physician Discharge Summary                [address]               [place], [place] [address]                      [phone]   Patient ID: Amanda Carpenter MRN: [id number] DOB/AGE: 1946-03-04 78 y.o.  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   OA (osteoarthritis) of knee 04/02/2014   Borderline diabetes 08/14/2009   Discharge Diagnoses:    Patient Active Problem List   Diagnosis Date Noted   Status post robot-assisted surgical procedure 03/22/2024   S/P repair of paraesophageal hernia 03/22/2024   Chronic bronchitis (HCC) 02/06/2019   Senile purpura 02/06/2019   Dyshidrotic eczema 01/31/2018   Lower extremity edema 01/25/2017   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 female   PRE-OPERATIVE DIAGNOSIS:  PARAESOPHAGEAL HERNIA   POST-OPERATIVE DIAGNOSIS:  PARAESOPHAGEAL HERNIA   PROCEDURE:  Procedure(s): REPAIR, HERNIA, PARAESOPHAGEAL, ROBOT-ASSISTED USING OVITEX REINFORCED TISSUE MATRIX (N/A) EGD (ESOPHAGOGASTRODUODENOSCOPY)  (N/A)   SURGEON:  Surgeons and Role:    * Shyrl Linnie KIDD, MD - Primary   PHYSICIAN ASSISTANT: WAYNE GOLD PA-C   ANESTHESIA:   general AND LOCAL     Referring: Mevelyn Romero KATHEE DEVONNA Primary Care: Bertrum Charlie CROME, MD Primary Cardiologist: None   History of Present Illness:    Amanda Carpenter [age] y.o. female presents for surgical evaluation of a moderate hiatal hernia.  She states that she has had reflux for a number of years, and of late she has been having dysphagia and regurgitation of food when she coughs.  She is unable to lay flat.  She denies any odynophagia.  Her weight has been stable.  The scan shows it to be a moderate-sized hiatal hernia however it appears much larger on upper endoscopy which was performed in June of this year.  Following following further discussion of all risks and benefits Dr. Shyrl recommended proceeding with robotic assisted hiatal hernia repair.  She was admitted this hospitalization for the procedure.  Hospital course:  Patient was admitted electively on 03/22/2024 at which time she was taken the operating room and underwent a robotic assisted laparoscopic hernia repair by Dr. Shyrl.  She tolerated the procedure well and was taken to the postanesthesia care unit in stable condition.  Postoperative hospital course:  The patient has done well.  She has maintained good pain control.  He has a very minor expected acute blood loss anemia.  She is voiding well and has maintained normal renal function.  Swallow study was done on postop day 1 and showed a small residual hiatus hernia and no evidence of leak.  She was placed on  a dysphagia 1 diet. She tolerated it well.   The patient has continued to progress without difficulty.  Her pain remains controlled.  She denies N/V.  She is ambulating without difficulty.  She is medically stable for discharge home today.    Latest Vital Signs: Blood pressure (!) 144/61, pulse 74, temperature 98.2 F (36.8  C), temperature source Oral, resp. rate 18, height 5' 4 (1.626 m), weight 115.8 kg, SpO2 98%.  Physical Exam:  General appearance: alert, cooperative, and no distress... Obese Heart: regular rate and rhythm Lungs: clear to auscultation bilaterally Abdomen: soft, non-tender; bowel sounds normal; no masses,  no organomegaly Extremities: extremities normal, atraumatic, no cyanosis or edema Wound: clean and dry     Discharge Condition:Good   Recent laboratory studies:  Lab Results  Component Value Date   WBC 10.9 (H) 03/24/2024   HGB 10.5 (L) 03/24/2024   HCT 33.2 (L) 03/24/2024   MCV 83.2 03/24/2024   PLT 243 03/24/2024   Lab Results  Component Value Date   NA 139 03/24/2024   K 4.0 03/24/2024   CL 105 03/24/2024   CO2 25 03/24/2024   CREATININE 0.89 03/24/2024   GLUCOSE 141 (H) 03/24/2024      Diagnostic Studies: DG ESOPHAGUS W SINGLE CM (SOL OR THIN BA) Result Date: 03/23/2024 CLINICAL DATA:  [id number] S/P repair of paraesophageal hernia [id number] 78 year old female s/p robot-assisted hiatal hernia repair 03/22/24. Barium swallow requested for post-operative assessment. EXAM: DG ESOPHAGUS TECHNIQUE: Single contrast examination was performed using Omnipaque 300. This exam was performed by Solmon Ku PA-C, and was supervised and interpreted by Thom Hall, MD. FLUOROSCOPY: Radiation Exposure Index (as provided by the fluoroscopic device): 13.8 mGy Kerma COMPARISON:  Chest XR, 03/23/2024.  CT chest, 02/18/2024. FINDINGS: Patient with intact swallow. No vestibular penetration or aspiration seen. Pharynx unremarkable. Esophagus normal in contour and course. Gastroesophageal junction intact. Contrast readily empties into the stomach. A small residual hiatus hernia is present. No contrast extravasation noted. IMPRESSION: 1. Small residual hiatus hernia. 2. Postsurgical changes of recent hiatal hernia repair. No extraluminal contrast extravasation. Electronically Signed   By: Thom Hall  M.D.   On: 03/23/2024 12:11    Discharge Medications: Allergies as of 03/24/2024       Reactions   Motrin [ibuprofen] Other (See Comments)   COLLAGENOUS COLITIS        Medication List     STOP taking these medications    UNABLE TO FIND       TAKE these medications    acetaminophen  160 MG/5ML solution Commonly known as: TYLENOL  Take 20.3 mLs (650 mg total) by mouth every 6 (six) hours as needed for mild pain (pain score 1-3) or moderate pain (pain score 4-6).   CALCIUM + D3 PO Take 1 tablet by mouth daily.   ELDERBERRY PO Take 2 each by mouth in the morning. Gummies   fluticasone 50 MCG/ACT nasal spray Commonly known as: FLONASE Place 1 spray into both nostrils at bedtime as needed (nasal congestion.).   MAGNESIUM  COMPLEX PO Take 1 capsule by mouth daily.   omeprazole  40 MG capsule Commonly known as: PRILOSEC Take 1 capsule (40 mg total) by mouth daily before lunch.   ondansetron  4 MG disintegrating tablet Commonly known as: ZOFRAN -ODT Take 1  tablet (4 mg total) by mouth every 6 (six) hours as needed for nausea.   oxyCODONE  5 MG/5ML solution Commonly known as: ROXICODONE  Take 5 mLs (5 mg total) by mouth every 4 (four) hours as needed for severe pain (pain score 7-10).   VITAMIN B-12 PO Take 1 tablet by mouth daily.        Follow Up Appointments:  Follow-up Information     Lightfoot, Linnie KIDD, MD Follow up.   Specialty: Cardiothoracic Surgery Why: Please see your discharge paperwork for details of follow-up appointment with the surgeons office. Contact information: [address], [place] [address] [phone]                 Signed: Rocky Plan 03/24/2024, 7:40 AM

## 2024-03-22 NOTE — Transfer of Care (Signed)
 Immediate Anesthesia Transfer of Care Note  Patient: Amanda Carpenter  Procedure(s) Performed: REPAIR, HERNIA, PARAESOPHAGEAL, ROBOT-ASSISTED USING OVITEX REINFORCED TISSUE MATRIX (Chest) EGD (ESOPHAGOGASTRODUODENOSCOPY)  Patient Location: PACU  Anesthesia Type:General  Level of Consciousness: awake, alert , and oriented  Airway & Oxygen Therapy: Patient Spontanous Breathing and Patient connected to face mask oxygen  Post-op Assessment: Report given to RN and Post -op Vital signs reviewed and stable  Post vital signs: Reviewed and stable  Last Vitals:  Vitals Value Taken Time  BP 124/53 03/22/24 15:55  Temp    Pulse 87 03/22/24 15:59  Resp 18 03/22/24 15:59  SpO2 98 % 03/22/24 15:59  Vitals shown include unfiled device data.  Last Pain:  Vitals:   03/22/24 1052  TempSrc:   PainSc: 0-No pain         Complications: No notable events documented.

## 2024-03-22 NOTE — Anesthesia Procedure Notes (Addendum)
 Procedure Name: Intubation Date/Time: 03/22/2024 12:14 PM  Performed by: Delores Dus, CRNAPre-anesthesia Checklist: Patient identified, Emergency Drugs available, Suction available and Patient being monitored Patient Re-evaluated:Patient Re-evaluated prior to induction Oxygen Delivery Method: Circle system utilized Preoxygenation: Pre-oxygenation with 100% oxygen Induction Type: IV induction Ventilation: Mask ventilation without difficulty Laryngoscope Size: Glidescope and 3 Grade View: Grade I Tube type: Oral Tube size: 7.0 mm Number of attempts: 1 Airway Equipment and Method: Oral airway and Rigid stylet Placement Confirmation: ETT inserted through vocal cords under direct vision, positive ETCO2 and breath sounds checked- equal and bilateral Secured at: 21 cm Tube secured with: Tape Dental Injury: Teeth and Oropharynx as per pre-operative assessment

## 2024-03-22 NOTE — Plan of Care (Signed)

## 2024-03-22 NOTE — Discharge Instructions (Addendum)
 Discharge Instructions:  1. You may shower, please wash incisions daily with soap and water  and keep dry.  If you wish to cover wounds with dressing you may do so but please keep clean and change daily.  No tub baths or swimming until incisions have completely healed.  If your incisions become red or develop any drainage please call our office at [phone]  2. No Driving until cleared by SURGEON office and you are no longer using narcotic pain medications  3. Monitor your weight daily.. Please use the same scale and weigh at same time... If you gain 5-10 lbs in 48 hours with associated lower extremity swelling, please contact our office at [phone]  4. Fever of 101.5 for at least 24 hours with no source, please contact our office at [phone]  5. Activity- up as tolerated, please walk at least 3 times per day.  Avoid strenuous activity, no lifting, pushing, or pulling with your arms over 8-10 lbs for a minimum of 6 weeks  Please crush any medicine unless labled specifically not to crush Hold Any vitamins or supplements until taking full diet  6. If any questions or concerns arise, please do not hesitate to contact our office at [phone] Instructions:

## 2024-03-23 ENCOUNTER — Inpatient Hospital Stay (HOSPITAL_COMMUNITY)

## 2024-03-23 ENCOUNTER — Encounter (HOSPITAL_COMMUNITY): Payer: Self-pay | Admitting: Thoracic Surgery (Cardiothoracic Vascular Surgery)

## 2024-03-23 LAB — CBC
HCT: 34.6 % — ABNORMAL LOW (ref 36.0–46.0)
Hemoglobin: 11.1 g/dL — ABNORMAL LOW (ref 12.0–15.0)
MCH: 26.6 pg (ref 26.0–34.0)
MCHC: 32.1 g/dL (ref 30.0–36.0)
MCV: 83 fL (ref 80.0–100.0)
Platelets: 264 K/uL (ref 150–400)
RBC: 4.17 MIL/uL (ref 3.87–5.11)
RDW: 15.8 % — ABNORMAL HIGH (ref 11.5–15.5)
WBC: 9.9 K/uL (ref 4.0–10.5)
nRBC: 0 % (ref 0.0–0.2)

## 2024-03-23 LAB — BASIC METABOLIC PANEL WITH GFR
Anion gap: 12 (ref 5–15)
BUN: 11 mg/dL (ref 8–23)
CO2: 23 mmol/L (ref 22–32)
Calcium: 8.7 mg/dL — ABNORMAL LOW (ref 8.9–10.3)
Chloride: 101 mmol/L (ref 98–111)
Creatinine, Ser: 0.93 mg/dL (ref 0.44–1.00)
GFR, Estimated: >60 mL/min (ref >60)
Glucose, Bld: 170 mg/dL — ABNORMAL HIGH (ref 70–99)
Potassium: 4.3 mmol/L (ref 3.5–5.1)
Sodium: 136 mmol/L (ref 135–145)

## 2024-03-23 MED ORDER — ACETAMINOPHEN 160 MG/5ML PO SOLN
650.0000 mg | Freq: Four times a day (QID) | ORAL | Status: DC | PRN
  Administered 2024-03-23 (×2): 650 mg via ORAL
  Filled 2024-03-23 (×2): qty 20.3

## 2024-03-23 MED ORDER — IOHEXOL 300 MG/ML  SOLN
100.0000 mL | Freq: Once | INTRAMUSCULAR | Status: AC | PRN
  Administered 2024-03-23: 50 mL via ORAL

## 2024-03-23 MED ORDER — SODIUM CHLORIDE (PF) 0.9 % IJ SOLN
INTRAMUSCULAR | Status: AC
  Administered 2024-03-23: 10 mL
  Filled 2024-03-23: qty 10

## 2024-03-23 NOTE — Progress Notes (Signed)
 Mobility Specialist Progress Note;   03/23/24 0915  Mobility  Activity Ambulated independently;Pivoted/transferred from bed to chair  Level of Assistance Standby assist, set-up cues, supervision of patient - no hands on  Assistive Device None  Distance Ambulated (ft) 300 ft  Activity Response Tolerated well  Mobility Referral Yes  Mobility visit 1 Mobility  Mobility Specialist Start Time (ACUTE ONLY) 0915  Mobility Specialist Stop Time (ACUTE ONLY) 0926  Mobility Specialist Time Calculation (min) (ACUTE ONLY) 11 min   Pt eager for mobility. Required no physical assistance during ambulation, SV for safety. HR up to 128 w/ exertion. Requested to sit in chair at [facility]. Pt left with all needs met, call bell in reach.   Lauraine Erm Mobility Specialist Please contact via SecureChat or [employer] [phone]

## 2024-03-23 NOTE — TOC CM/SW Note (Signed)
 Transition of Care [facility]) - Inpatient Brief Assessment   Patient Details  Name: Amanda Carpenter MRN: [id number] Date of Birth: [birth year]  [facility] CM/SW Contact:    Lauraine FORBES Saa, LCSWA Phone Number: 03/23/2024, 9:19 AM   Clinical Narrative:  9:19 AM Per chart review, patient resides at home with spouse. Patient has a PCP and insurance. Patient does not have SNF/HH/DME history. Patient's preferred pharmacy's are [facility] and [facility]. No TOC needs identified at this time. TOC will continue to follow.   Transition of Care Asessment: Insurance and Status: Insurance coverage has been reviewed Patient has primary care physician: Yes Home environment has been reviewed: Private Residence Prior level of function:: N/A Prior/Current Home Services: No current home services Social Drivers of Health Review: SDOH reviewed no interventions necessary Readmission risk has been reviewed: Yes (Currently Green 8%) Transition of care needs: no transition of care needs at this time

## 2024-03-23 NOTE — Progress Notes (Addendum)
 1 Day Post-Op Procedure(s) (LRB): REPAIR, HERNIA, PARAESOPHAGEAL, ROBOT-ASSISTED USING OVITEX REINFORCED TISSUE MATRIX (N/A) EGD (ESOPHAGOGASTRODUODENOSCOPY) (N/A) Subjective: Feels ok , no pain  Objective: Vital signs in last 24 hours: Temp:  [97.8 F (36.6 C)-99.2 F (37.3 C)] 98.1 F (36.7 C) (11/13 0329) Pulse Rate:  [69-109] 109 (11/13 0329) Cardiac Rhythm: Normal sinus rhythm (11/12 2000) Resp:  [12-21] 18 (11/13 0329) BP: (104-187)/(52-118) 167/76 (11/13 0400) SpO2:  [90 %-98 %] 94 % (11/13 0331) Weight:  [115.8 kg-116.6 kg] 115.8 kg (11/12 1657)  Hemodynamic parameters for last 24 hours:    Intake/Output from previous day: 11/12 0701 - 11/13 0700 In: 1400 [I.V.:1200; IV Piggyback:200] Out: 200 [Urine:150; Blood:50] Intake/Output this shift: No intake/output data recorded.  General appearance: alert, cooperative, and no distress Heart: regular rate and rhythm Lungs: clear to auscultation bilaterally Abdomen: benign Extremities: SCD in place, no edema Wound: incis healing well  Lab Results: Recent Labs    03/22/24 1737 03/23/24 0155  WBC 11.5* 9.9  HGB 11.5* 11.1*  HCT 36.5 34.6*  PLT 266 264   BMET:  Recent Labs    03/20/24 1400 03/22/24 1737 03/23/24 0155  NA 139  --  136  K 3.8  --  4.3  CL 100  --  101  CO2 27  --  23  GLUCOSE 118*  --  170*  BUN 16  --  11  CREATININE 1.01* 0.96 0.93  CALCIUM 9.1  --  8.7*    PT/INR:  Recent Labs    03/20/24 1400  LABPROT 14.5  INR 1.1   ABG No results found for: PHART, HCO3, TCO2, ACIDBASEDEF, O2SAT CBG (last 3)  Recent Labs    03/20/24 1304 03/22/24 1556  GLUCAP 130* 181*    Meds Scheduled Meds:  enoxaparin (LOVENOX) injection  40 mg Subcutaneous Q24H   pantoprazole  (PROTONIX ) IV  40 mg Intravenous QHS   Continuous Infusions: PRN Meds:.acetaminophen  **OR** acetaminophen , fentaNYL  (SUBLIMAZE ) injection, hydrALAZINE, ondansetron  **OR** ondansetron  (ZOFRAN ) IV  Xrays DG Chest 1  View Result Date: 03/22/2024 CLINICAL DATA:  Status post esophageal hernia repair EXAM: PORTABLE CHEST 1 VIEW COMPARISON:  03/20/2024 FINDINGS: Cardiac shadow is mildly prominent but stable. Lungs are well aerated bilaterally. Previously seen hiatal hernia is no longer identified. No acute bony abnormality is noted. IMPRESSION: No active disease. Electronically Signed   By: Oneil Devonshire M.D.   On: 03/22/2024 20:03    Assessment/Plan: S/P Procedure(s) (LRB): REPAIR, HERNIA, PARAESOPHAGEAL, ROBOT-ASSISTED USING OVITEX REINFORCED TISSUE MATRIX (N/A) EGD (ESOPHAGOGASTRODUODENOSCOPY) (N/A)  POD#1  1 afeb, + HTN- has hydralizine prn order till can take PO- on no po home meds for BP- reports BP better at home and ' doesn't lke to take medicine' 2 O2 sats good on 4 liters 3 voiding- unmeasured 4 normal renal fxn 5 minor expected ABLA- monitor 6 CXR- trace apical pntx on right 7 swallow study this am 8 routine progression 9 lovenox SCD for DVT ppx     LOS: 1 day    Lemond FORBES Cera PA-C Pager [phone] 03/23/2024

## 2024-03-23 NOTE — Plan of Care (Signed)
  Problem: Education: Goal: Knowledge of General Education information will improve Description: Including pain rating scale, medication(s)/side effects and non-pharmacologic comfort measures Outcome: Progressing   Problem: Health Behavior/Discharge Planning: Goal: Ability to manage health-related needs will improve Outcome: Progressing   Problem: Activity: Goal: Risk for activity intolerance will decrease Outcome: Progressing   Problem: Nutrition: Goal: Adequate nutrition will be maintained Outcome: Progressing   Problem: Pain Managment: Goal: General experience of comfort will improve and/or be controlled Outcome: Progressing   Problem: Safety: Goal: Ability to remain free from injury will improve Outcome: Progressing   Problem: Skin Integrity: Goal: Risk for impaired skin integrity will decrease Outcome: Progressing

## 2024-03-24 ENCOUNTER — Other Ambulatory Visit (HOSPITAL_COMMUNITY): Payer: Self-pay

## 2024-03-24 LAB — CBC
HCT: 33.2 % — ABNORMAL LOW (ref 36.0–46.0)
Hemoglobin: 10.5 g/dL — ABNORMAL LOW (ref 12.0–15.0)
MCH: 26.3 pg (ref 26.0–34.0)
MCHC: 31.6 g/dL (ref 30.0–36.0)
MCV: 83.2 fL (ref 80.0–100.0)
Platelets: 243 K/uL (ref 150–400)
RBC: 3.99 MIL/uL (ref 3.87–5.11)
RDW: 16.2 % — ABNORMAL HIGH (ref 11.5–15.5)
WBC: 10.9 K/uL — ABNORMAL HIGH (ref 4.0–10.5)
nRBC: 0 % (ref 0.0–0.2)

## 2024-03-24 LAB — BASIC METABOLIC PANEL WITH GFR
Anion gap: 9 (ref 5–15)
BUN: 11 mg/dL (ref 8–23)
CO2: 25 mmol/L (ref 22–32)
Calcium: 8.4 mg/dL — ABNORMAL LOW (ref 8.9–10.3)
Chloride: 105 mmol/L (ref 98–111)
Creatinine, Ser: 0.89 mg/dL (ref 0.44–1.00)
GFR, Estimated: >60 mL/min (ref >60)
Glucose, Bld: 141 mg/dL — ABNORMAL HIGH (ref 70–99)
Potassium: 4 mmol/L (ref 3.5–5.1)
Sodium: 139 mmol/L (ref 135–145)

## 2024-03-24 MED ORDER — OXYCODONE HCL 5 MG/5ML PO SOLN
5.0000 mg | ORAL | 0 refills | Status: AC | PRN
  Filled 2024-03-24: qty 100, 4d supply, fill #0

## 2024-03-24 MED ORDER — OMEPRAZOLE 40 MG PO CPDR
40.0000 mg | DELAYED_RELEASE_CAPSULE | Freq: Every day | ORAL | Status: AC
Start: 2024-03-24 — End: ?

## 2024-03-24 MED ORDER — ACETAMINOPHEN 160 MG/5ML PO SOLN: 650.0000 mg | Freq: Four times a day (QID) | ORAL | Status: AC | PRN

## 2024-03-24 MED ORDER — ONDANSETRON 4 MG PO TBDP
4.0000 mg | ORAL_TABLET | Freq: Four times a day (QID) | ORAL | 0 refills | Status: AC | PRN
  Filled 2024-03-24: qty 20, 5d supply, fill #0

## 2024-03-24 NOTE — Plan of Care (Signed)
  Problem: Education: Goal: Knowledge of General Education information will improve Description: Including pain rating scale, medication(s)/side effects and non-pharmacologic comfort measures Outcome: Progressing   Problem: Clinical Measurements: Goal: Ability to maintain clinical measurements within normal limits will improve Outcome: Progressing Goal: Diagnostic test results will improve Outcome: Progressing Goal: Respiratory complications will improve Outcome: Progressing Goal: Cardiovascular complication will be avoided Outcome: Progressing   Problem: Activity: Goal: Risk for activity intolerance will decrease Outcome: Progressing   

## 2024-03-24 NOTE — TOC Transition Note (Signed)
 Transition of Care [facility]) - Discharge Note   Patient Details  Name: Amanda Carpenter MRN: [id number] Date of Birth: 1945/05/27  Transition of Care [facility]) CM/SW Contact:  Roxie KANDICE Stain, RN Phone Number: 03/24/2024, 8:45 AM   Clinical Narrative:    Amanda Carpenter is stable to discharge home. Follow up apt on AVS. No ICM (Inpatient Care Management) needs at this time.    Final next level of care: Home/Self Care Barriers to Discharge: Barriers Resolved   Patient Goals and CMS Choice Patient states their goals for this hospitalization and ongoing recovery are:: return home          Discharge Placement               Home        Discharge Plan and Services Additional resources added to the After Visit Summary for                                       Social Drivers of Health (SDOH) Interventions SDOH Screenings   Food Insecurity: No Food Insecurity (03/22/2024)  Housing: Low Risk  (03/22/2024)  Transportation Needs: No Transportation Needs (03/22/2024)  Utilities: Not At Risk (03/22/2024)  Alcohol Screen: Low Risk  (03/13/2022)  Depression (PHQ2-9): Low Risk  (03/13/2022)  Financial Resource Strain: Low Risk  (09/30/2023)   Received from [facility]  Physical Activity: Inactive (06/23/2023)   Received from [facility]  Social Connections: Socially Integrated (09/23/2021)  Stress: No Stress Concern Present (09/23/2021)  Tobacco Use: Medium Risk (03/22/2024)  Health Literacy: Adequate Health Literacy (06/23/2023)   Received from [facility]     Readmission Risk Interventions    03/24/2024    8:44 AM  Readmission Risk Prevention Plan  Post Dischage Appt Complete  Medication Screening Complete  Transportation Screening Complete

## 2024-03-24 NOTE — Care Management Important Message (Signed)
 Important Message  Patient Details  Name: Amanda Carpenter MRN: [id number] Date of Birth: Mar 31, 1946   Important Message Given:  Yes - Medicare IM  Patient left prior to IM delivery will mail a copy to the patient home address.    Earlyn Sylvan 03/24/2024, 12:33 PM

## 2024-03-24 NOTE — Progress Notes (Incomplete)
 Patient provided with verbal discharge instructions. Paper copy of discharge provided to patient. RN answered all questions. VSS at discharge. IV's removed. Patient belongings sent with patient. Patient dc'd via

## 2024-03-24 NOTE — Progress Notes (Signed)
      [address] [place] [address]             [phone]         2 Days Post-Op Procedure(s) (LRB): REPAIR, HERNIA, PARAESOPHAGEAL, ROBOT-ASSISTED USING OVITEX REINFORCED TISSUE MATRIX (N/A) EGD (ESOPHAGOGASTRODUODENOSCOPY) (N/A)  Subjective:  Sitting up in chair eating breakfast.  She feels good and is without complaints.  She questions when she can advance her diet.. Stressed importance of staying on dysphagia diet until advanced at her follow up appointment  Objective: Vital signs in last 24 hours: Temp:  [97.7 F (36.5 C)-98.5 F (36.9 C)] 98.2 F (36.8 C) (11/14 0510) Pulse Rate:  [72-86] 74 (11/14 0510) Cardiac Rhythm: Normal sinus rhythm (11/13 1946) Resp:  [15-18] 16 (11/14 0510) BP: (146-164)/(60-72) 149/65 (11/14 0510) SpO2:  [94 %-98 %] 97 % (11/14 0510)  Intake/Output from previous day: 11/13 0701 - 11/14 0700 In: 120 [P.O.:120] Out: -   General appearance: alert, cooperative, and no distress Heart: regular rate and rhythm Lungs: clear to auscultation bilaterally Abdomen: soft, non-tender; bowel sounds normal; no masses,  no organomegaly Extremities: extremities normal, atraumatic, no cyanosis or edema Wound: clean and dry  Lab Results: Recent Labs    03/23/24 0155 03/24/24 0159  WBC 9.9 10.9*  HGB 11.1* 10.5*  HCT 34.6* 33.2*  PLT 264 243   BMET:  Recent Labs    03/23/24 0155 03/24/24 0159  NA 136 139  K 4.3 4.0  CL 101 105  CO2 23 25  GLUCOSE 170* 141*  BUN 11 11  CREATININE 0.93 0.89  CALCIUM 8.7* 8.4*    PT/INR: No results for input(s): LABPROT, INR in the last 72 hours. ABG No results found for: PHART, HCO3, TCO2, ACIDBASEDEF, O2SAT CBG (last 3)  Recent Labs    03/22/24 1556  GLUCAP 181*    Assessment/Plan: S/P Procedure(s) (LRB): REPAIR, HERNIA, PARAESOPHAGEAL, ROBOT-ASSISTED USING OVITEX REINFORCED TISSUE MATRIX (N/A) EGD (ESOPHAGOGASTRODUODENOSCOPY) (N/A)  CV- NSR, BP  elevated.. no documented history of HTN will need to follow up with PCP Pulm- not requiring oxygen continue IS Renal-creatinine, electrolytes are normal GI- tolerating dysphagia diet, discussed with patient not advancing until cleared by Dr. Shyrl.. zofran  prn at discharge Dispo- patient doing well will d/c home today   LOS: 2 days    Rocky Shad, PA-C 03/24/2024 7:33 AM

## 2024-03-24 NOTE — Plan of Care (Signed)
  Problem: Education: Goal: Knowledge of General Education information will improve Description: Including pain rating scale, medication(s)/side effects and non-pharmacologic comfort measures Outcome: Progressing   Problem: Health Behavior/Discharge Planning: Goal: Ability to manage health-related needs will improve Outcome: Progressing   Problem: Clinical Measurements: Goal: Respiratory complications will improve Outcome: Progressing Goal: Cardiovascular complication will be avoided Outcome: Progressing   Problem: Nutrition: Goal: Adequate nutrition will be maintained Outcome: Progressing   Problem: Coping: Goal: Level of anxiety will decrease Outcome: Progressing   Problem: Elimination: Goal: Will not experience complications related to bowel motility Outcome: Progressing    Problem: Pain Managment: Goal: General experience of comfort will improve and/or be controlled Outcome: Progressing   Problem: Safety: Goal: Ability to remain free from injury will improve Outcome: Progressing   Problem: Skin Integrity: Goal: Risk for impaired skin integrity will decrease Outcome: Progressing

## 2024-03-24 NOTE — Progress Notes (Signed)
Patient provided with verbal discharge instructions. Paper copy of discharge provided to patient. RN answered all questions. VSS at discharge. IV's removed. Patient belongings sent with patient. Patient dc'd via wheelchair to private vehicle.

## 2024-03-24 NOTE — Plan of Care (Signed)
  Problem: Education: Goal: Knowledge of General Education information will improve Description: Including pain rating scale, medication(s)/side effects and non-pharmacologic comfort measures 03/24/2024 0851 by Blinda Georges BIRCH, RN Outcome: Adequate for Discharge 03/24/2024 0801 by Blinda Georges BIRCH, RN Outcome: Progressing   Problem: Health Behavior/Discharge Planning: Goal: Ability to manage health-related needs will improve 03/24/2024 0851 by Blinda Georges BIRCH, RN Outcome: Adequate for Discharge 03/24/2024 0801 by Blinda Georges BIRCH, RN Outcome: Progressing   Problem: Clinical Measurements: Goal: Ability to maintain clinical measurements within normal limits will improve Outcome: Adequate for Discharge Goal: Will remain free from infection Outcome: Adequate for Discharge Goal: Diagnostic test results will improve Outcome: Adequate for Discharge Goal: Respiratory complications will improve 03/24/2024 0851 by Blinda Georges BIRCH, RN Outcome: Adequate for Discharge 03/24/2024 0801 by Blinda Georges BIRCH, RN Outcome: Progressing Goal: Cardiovascular complication will be avoided 03/24/2024 0851 by Blinda Georges BIRCH, RN Outcome: Adequate for Discharge 03/24/2024 0801 by Blinda Georges BIRCH, RN Outcome: Progressing   Problem: Activity: Goal: Risk for activity intolerance will decrease Outcome: Adequate for Discharge   Problem: Nutrition: Goal: Adequate nutrition will be maintained 03/24/2024 0851 by Blinda Georges BIRCH, RN Outcome: Adequate for Discharge 03/24/2024 0801 by Blinda Georges BIRCH, RN Outcome: Progressing   Problem: Coping: Goal: Level of anxiety will decrease 03/24/2024 0851 by Blinda Georges BIRCH, RN Outcome: Adequate for Discharge 03/24/2024 0801 by Blinda Georges BIRCH, RN Outcome: Progressing   Problem: Elimination: Goal: Will not experience complications related to bowel motility 03/24/2024 0851 by Blinda Georges BIRCH, RN Outcome: Adequate for Discharge 03/24/2024 0801 by Blinda Georges BIRCH, RN Outcome: Progressing Goal: Will not experience complications related to urinary retention 03/24/2024 0851 by Blinda Georges BIRCH, RN Outcome: Adequate for Discharge 03/24/2024 0801 by Blinda Georges BIRCH, RN Outcome: Progressing   Problem: Pain Managment: Goal: General experience of comfort will improve and/or be controlled 03/24/2024 0851 by Blinda Georges BIRCH, RN Outcome: Adequate for Discharge 03/24/2024 0801 by Blinda Georges BIRCH, RN Outcome: Progressing   Problem: Safety: Goal: Ability to remain free from injury will improve 03/24/2024 0851 by Blinda Georges BIRCH, RN Outcome: Adequate for Discharge 03/24/2024 0801 by Blinda Georges BIRCH, RN Outcome: Progressing   Problem: Skin Integrity: Goal: Risk for impaired skin integrity will decrease 03/24/2024 0851 by Blinda Georges BIRCH, RN Outcome: Adequate for Discharge 03/24/2024 0801 by Blinda Georges BIRCH, RN Outcome: Progressing

## 2024-03-28 ENCOUNTER — Encounter (HOSPITAL_COMMUNITY): Payer: Self-pay | Admitting: Thoracic Surgery (Cardiothoracic Vascular Surgery)

## 2024-04-03 ENCOUNTER — Ambulatory Visit

## 2024-04-03 ENCOUNTER — Ambulatory Visit: Payer: Self-pay

## 2024-04-03 VITALS — BP 127/70 | HR 78 | Resp 20 | Ht 64.0 in | Wt 250.0 lb

## 2024-04-03 DIAGNOSIS — K449 Diaphragmatic hernia without obstruction or gangrene: Secondary | ICD-10-CM

## 2024-04-03 DIAGNOSIS — Z8719 Personal history of other diseases of the digestive system: Secondary | ICD-10-CM

## 2024-04-03 DIAGNOSIS — Z9889 Other specified postprocedural states: Secondary | ICD-10-CM

## 2024-04-03 NOTE — Patient Instructions (Signed)
Follow up in one month with chest xray

## 2024-04-03 NOTE — Op Note (Signed)
 [address] [place] [address]             [phone]      04/03/2024  Patient:  Amanda Carpenter Pre-Op Dx: paraesophageal hernia   Post-op Dx:  same Procedure: - Esophagoscopy - Robotic assisted laparoscopy - Paraesophageal hernia repair  Ovitex mesh pledgets   Surgeon and Role:      * Shyrl Linnie KIDD, MD - Primary  Assistant: MICAEL Cera, PA-C  An experienced assistant was required given the complexity of this surgery and the standard of surgical care. The assistant was needed for exposure, dissection, suctioning, retraction of delicate tissues and sutures, instrument exchange and for overall help during this procedure.   Anesthesia  general EBL:  15ml Blood Administration: none Specimen:  none   Counts: correct   Indications: 78yo female with moderate sized hiatal hernia on scan.  It appears much larger on upper endoscopy performed in June of this year.  We discussed the risks and benefits of left upper endoscopy, and robotic assisted paraesophageal hernia repair with fundoplication.  She is agreeable to proceed.  Findings: Large defect with stomach and omentum in the hernia  Operative Technique: After the risks, benefits and alternatives were thoroughly discussed, the patient was brought to the operative theatre.  Anesthesia was induced, and the esophagoscope was passed through the oropharynx down to the stomach.  The scope was retroflexed and the hiatal hernia was clearly evident.  The scope was pulled back and the mucosal surface of the esophagus was visualized.    The scope was then parked at 25 cm from the incisors.  The patient was then prepped and draped in normal sterile fashion.  An appropriate surgical pause was performed, and pre-operative antibiotics were dosed accordingly.  We began with a 1 cm incision 15 cm caudad from the xiphoid and slightly lateral to the umbilicus.  Using an Optiview we entered the peritoneal  space.  The abdomen was then insufflated with CO2.  3 other robotic ports were placed to triangulate the hiatus.  Another 12 mm port was placed in place at the level of the umbilicus laterally for an assistant port and another 5 mm trocar was placed in the right lower quadrant for liver retractor.  The patient was then placed in steep reverse Trendelenburg and the liver was elevated to expose the esophageal hiatus.  And then the robot was docked.  We began by dividing the gastrohepatic ligament to expose the right diaphragmatic crus and then dissected the hernia sac in a clockwise fashion to mobilize there the stomach and esophagus.  We then divided the short gastrics and moved towards the right crus and completed our dissection along the esophageal hiatus.  A Penrose drain was then used to encircle the the esophagus and we continued our dissection up into the mediastinum.  Once we had achieved 3 to 4 cm of intra-abdominal esophagus we then proceeded to reapproximate the crura with 0 Ethibond sutures in an interrupted fashion.   The gastroscope was passed down through the lower esophageal sphincter into the stomach and would act as our bougie during this repair.   An air leak test was performed using the gastroscope.  No leak was evident.  The liver retractor was removed and all ports were removed under direct visualization.  The skin and soft tissue were closed with absorbable suture    The patient tolerated the procedure without any immediate complications,  and was transferred to the PACU in stable condition.  Amanda Carpenter

## 2024-04-03 NOTE — Progress Notes (Signed)
 [address] [place] Amanda Carpenter [address]             [phone]       HPI:  Patient returns for routine postoperative follow-up having undergone robot assisted paraesophageal hernia repair using ovitex reinforced tissue matrix on 03/22/2024 with Dr. Shyrl.  The patient's early postoperative recovery while in the hospital was notable for having a swallow study done post op day 1 and showed a small residual hernia and no evidence of leak. She was placed on a dysphagia 1 diet which she tolerated well.  She was stable for discharge home on 03/24/2024.  Since hospital discharge the patient reports that she has been doing well.  Her pain is well controlled and she is not requiring any medications at this time.  She has had a dry cough since surgery.  She has been using cough syrup at night which has helped.  She denies fever, chills and shortness of breath.  She has been able to tolerate her dysphagia diet and has not had any reflux, regurgitation or dysphagia.  She has not taken Prilosec since surgery and remains symptom free.   Allergies as of 04/03/2024       Reactions   Motrin [ibuprofen] Other (See Comments)   COLLAGENOUS COLITIS        Medication List        Accurate as of April 03, 2024  1:12 PM. If you have any questions, ask your nurse or doctor.          acetaminophen  160 MG/5ML solution Commonly known as: TYLENOL  Take 20.3 mLs (650 mg total) by mouth every 6 (six) hours as needed for mild pain (pain score 1-3) or moderate pain (pain score 4-6).   CALCIUM + D3 PO Take 1 tablet by mouth daily.   ELDERBERRY PO Take 2 each by mouth in the morning. Gummies   fluticasone 50 MCG/ACT nasal spray Commonly known as: FLONASE Place 1 spray into both nostrils at bedtime as needed (nasal congestion.).   MAGNESIUM  COMPLEX PO Take 1 capsule by mouth daily.   omeprazole  40 MG capsule Commonly known as: PRILOSEC Take 1 capsule (40 mg total) by  mouth daily before lunch.   ondansetron  4 MG disintegrating tablet Commonly known as: ZOFRAN -ODT Take 1 tablet (4 mg total) by mouth every 6 (six) hours as needed for nausea.   oxyCODONE  5 MG/5ML solution Commonly known as: ROXICODONE  Take 5 mLs (5 mg total) by mouth every 4 (four) hours as needed for severe pain (pain score 7-10).   VITAMIN B-12 PO Take 1 tablet by mouth daily.         ROS  Review of Systems  Constitutional:  Negative for chills and fever.  Respiratory:  Positive for cough. Negative for shortness of breath.   Cardiovascular:  Negative for chest pain and leg swelling.  Gastrointestinal:  Negative for abdominal pain, diarrhea, heartburn, nausea and vomiting.     BP 127/70   Pulse 78   Resp 20   Ht 5' 4 (1.626 m)   Wt 250 lb (113.4 kg)   SpO2 95% Comment: RA  BMI 42.91 kg/m   Physical Exam Constitutional:      Appearance: Normal appearance.  HENT:     Head: Normocephalic and atraumatic.  Musculoskeletal:     Cervical back: Normal range of motion.  Skin:    General: Skin is warm and dry.  Neurological:     General: No focal deficit present.     Mental Status: She is alert.       Imaging: N/A   Assessment/Plan:  S/P repair of paraesophageal hernia -We reviewed her diet and she is able to advance her diet to tender foods for one week.  After this she can advance it further as tolerated. She should continue to eat smaller meals and chew her food thoroughly.  -She should continue to not take Prilosec  -She can use OTC cough syrup as needed for coughing.  She can use spoonfuls of honey, cough drops and warm liquids to help with cough suppression.  -Incision sites are healing appropriately, she should continue to keep incisions clean and dry -Pain is well controlled, she can use tylenol  as needed -Follow up in one month with chest xray   Amanda CHRISTELLA Rough, PA-C 1:12 PM 04/03/24

## 2024-04-14 ENCOUNTER — Ambulatory Visit: Payer: Self-pay | Admitting: Thoracic Surgery (Cardiothoracic Vascular Surgery)

## 2024-04-28 ENCOUNTER — Other Ambulatory Visit: Payer: Self-pay | Admitting: Thoracic Surgery (Cardiothoracic Vascular Surgery)

## 2024-04-28 DIAGNOSIS — K449 Diaphragmatic hernia without obstruction or gangrene: Secondary | ICD-10-CM

## 2024-05-10 ENCOUNTER — Ambulatory Visit (HOSPITAL_COMMUNITY)
Admission: RE | Admit: 2024-05-10 | Discharge: 2024-05-10 | Disposition: A | Discharge status: Home / Self Care | Source: Ambulatory Visit | Attending: Cardiology | Admitting: Cardiology

## 2024-05-10 ENCOUNTER — Ambulatory Visit

## 2024-05-10 VITALS — BP 92/53 | HR 85 | Resp 18 | Ht 64.0 in | Wt 245.0 lb

## 2024-05-10 DIAGNOSIS — K449 Diaphragmatic hernia without obstruction or gangrene: Secondary | ICD-10-CM

## 2024-05-10 DIAGNOSIS — Z9889 Other specified postprocedural states: Secondary | ICD-10-CM

## 2024-05-10 DIAGNOSIS — Z8719 Personal history of other diseases of the digestive system: Secondary | ICD-10-CM

## 2024-05-10 MED ORDER — CEPHALEXIN 500 MG PO CAPS: 500.0000 mg | ORAL_CAPSULE | Freq: Two times a day (BID) | ORAL | 0 refills | Status: AC

## 2024-05-10 MED ORDER — MUPIROCIN 2 % EX OINT: 1.0000 | TOPICAL_OINTMENT | Freq: Three times a day (TID) | CUTANEOUS | 0 refills | Status: AC

## 2024-05-10 NOTE — Progress Notes (Signed)
 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 Shyrl.  The patient's early postoperative recovery while in the hospital was notable for having a swallow study done post op day 1 and showed a small residual hernia and no evidence of leak. She was placed on a dysphagia 1 diet which she tolerated well.  She was stable for discharge home on 03/24/2024.  Since hospital discharge the patient reports that she has been doing well.  She is still experiencing a dry cough at times.  She also has had some acid reflux when eating foods that are acidic such as tomato sauce. She has been using Tums as needed.  She has not correlated the dry cough with reflux symptoms. Her incision sites have also had some erythema and drainage. She has been keeping them clean and has been putting neosporin on them. She has been able to advance her diet without issue.  She denies regurgitation, dysphagia, fever, chills and shortness of breath.    Allergies as of 05/10/2024       Reactions   Motrin [ibuprofen] Other (See Comments)   COLLAGENOUS COLITIS        Medication List        Accurate as of May 10, 2024 11:59 PM. If you have any questions, ask your nurse or doctor.          acetaminophen  160 MG/5ML solution Commonly known as: TYLENOL  Take 20.3 mLs (650 mg total) by mouth every 6 (six) hours as needed for mild pain (pain score 1-3) or moderate pain (pain score 4-6).   CALCIUM + D3 PO Take 1 tablet by mouth daily.   cephALEXin  500 MG capsule Commonly known as: KEFLEX  Take 1 capsule (500 mg total) by mouth 2 (two) times daily for 10 days.   ELDERBERRY PO Take 2 each by mouth in the morning. Gummies   fluticasone 50 MCG/ACT nasal spray Commonly known as:  FLONASE Place 1 spray into both nostrils at bedtime as needed (nasal congestion.).   MAGNESIUM  COMPLEX PO Take 1 capsule by mouth daily.   mupirocin  ointment 2 % Commonly known as: BACTROBAN  Apply 1 Application topically 3 (three) times daily.   omeprazole  40 MG capsule Commonly known as: PRILOSEC Take 1 capsule (40 mg total) by mouth daily before lunch.   ondansetron  4 MG disintegrating tablet Commonly known as: ZOFRAN -ODT Take 1 tablet (4 mg total) by mouth every 6 (six) hours as needed for nausea.   oxyCODONE  5 MG/5ML solution Commonly known as: ROXICODONE  Take 5 mLs (5 mg total) by mouth every 4 (four) hours as needed for severe pain (pain score 7-10).   VITAMIN B-12 PO Take 1 tablet by mouth daily.         ROS  Review of Systems  Constitutional:  Negative for chills and fever.  Respiratory:  Positive for cough. Negative for shortness of breath.   Cardiovascular:  Negative for chest pain and leg swelling.  Gastrointestinal:  Negative for abdominal pain, diarrhea, heartburn, nausea and vomiting.     BP (!) 92/53   Pulse 85   Resp 18   Ht 5' 4 (1.626 m)   Wt 245 lb (111.1 kg)   SpO2  95% Comment: RA  BMI 42.05 kg/m   Physical Exam Constitutional:      Appearance: Normal appearance.  HENT:     Head: Normocephalic and atraumatic.  Musculoskeletal:     Cervical back: Normal range of motion.  Skin:    General: Skin is warm and dry.      Neurological:     General: No focal deficit present.     Mental Status: She is alert.       Imaging: EXAM: 2 VIEW(S) XRAY OF THE CHEST 05/10/2024 10:10:00 AM   COMPARISON: CXR 03/23/2024, CT Chest 02/18/2024.   CLINICAL HISTORY: s/p repair of paraesophageal hernia   FINDINGS:   LUNGS AND PLEURA: No focal pulmonary opacity. No pleural effusion. No pneumothorax.   HEART AND MEDIASTINUM: No acute abnormality of the cardiac and mediastinal silhouettes.   BONES AND SOFT TISSUES: Multilevel degenerative  disc disease of spine. No acute osseous abnormality.   IMPRESSION: 1. No acute cardiopulmonary process.   Electronically signed by: Morgane Naveau MD 05/10/2024 11:02 AM EST RP Workstation: [id number]   Assessment/Plan:  S/P repair of paraesophageal hernia -We reviewed todays chest xray which showed no acute process -Discussed dry cough could be a sign of silent reflux.  She is to try and correlate coughing with eating foods that would previously exacerbate her GERD.  Also discussed that she should see if her coughing is reduced if she takes tums.  If she notes that coughing starts after eating food that cause reflux then she may need to restart taking Prilosec  -Discussed that she should continue to eat smaller meals, stay upright for at least 1 hour after eating meals and chew her food thoroughly -Prescribed Keflex  and mupirocin  ointment for purulent drainage from incision sites. Discussed that if they do not improved she should contact our clinic    Manuelita CHRISTELLA Rough, PA-C 1:19 PM 05/13/2024  "

## 2024-05-13 NOTE — Patient Instructions (Addendum)
 Dry cough could be a sign of silent reflux.  Please see if coughing is related to eating foods that exacerbate your acid reflux.   See if your coughing is reduced if you take tums.  Continue to eat small meals, stay upright for at least 1 hour after eating and chew your food thoroughly.  If have improvement of coughing with tums, then cough may be due to reflux and you may need to restart omeprazole .
